# Patient Record
Sex: Male | Born: 1965 | Race: White | Hispanic: No | Marital: Single | Smoking: Current every day smoker
Health system: Southern US, Community
[De-identification: ages and names within clinical notes are randomized; demographics above are authoritative.]

## PROBLEM LIST (undated history)

## (undated) DIAGNOSIS — I10 Essential (primary) hypertension: Secondary | ICD-10-CM

## (undated) HISTORY — PX: FOOT AMPUTATION THROUGH METATARSAL: SHX644

## (undated) HISTORY — PX: EYE SURGERY: SHX253

---

## 2017-12-15 ENCOUNTER — Encounter (HOSPITAL_COMMUNITY): Payer: Self-pay | Admitting: Emergency Medicine

## 2017-12-15 ENCOUNTER — Emergency Department (HOSPITAL_COMMUNITY): Payer: Medicaid Other

## 2017-12-15 ENCOUNTER — Other Ambulatory Visit: Payer: Self-pay

## 2017-12-15 ENCOUNTER — Emergency Department (HOSPITAL_COMMUNITY)
Admission: EM | Admit: 2017-12-15 | Discharge: 2017-12-16 | Disposition: A | Discharge status: Home / Self Care | Payer: Medicaid Other | Attending: Emergency Medicine | Admitting: Emergency Medicine

## 2017-12-15 DIAGNOSIS — M79671 Pain in right foot: Secondary | ICD-10-CM | POA: Diagnosis present

## 2017-12-15 DIAGNOSIS — Z79899 Other long term (current) drug therapy: Secondary | ICD-10-CM | POA: Insufficient documentation

## 2017-12-15 MED ORDER — KETOROLAC TROMETHAMINE 60 MG/2ML IM SOLN
60.0000 mg | Freq: Once | INTRAMUSCULAR | Status: AC
  Administered 2017-12-16: 60 mg via INTRAMUSCULAR
  Filled 2017-12-15: qty 2

## 2017-12-15 NOTE — ED Triage Notes (Addendum)
Patient c/o right foot pain x 3 days. Denies injury. Ambulatory. Partial foot amputation. Hx osteomyelitis.

## 2017-12-15 NOTE — ED Provider Notes (Signed)
[facility]-EMERGENCY DEPT Provider Note   CSN: [id number] Arrival date & time: 12/15/17  1850     History   Chief Complaint Chief Complaint  Patient presents with  . Foot Pain    HPI Dwayne Alvarez is a 52 y.o. male.   52 year old male presents to the emergency department for evaluation of right foot pain x3 days.  He describes the pain as burning.  It radiates slightly proximally along the medial aspect of his lower extremity.  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  Amputation undergone in the setting of osteomyelitis.  The patient expresses concern that he may be developing osteomyelitis again.  He has been taking Tylenol and ibuprofen for symptoms without relief.  His pain is aggravated with weightbearing.  He denies any known trauma or injury.  No associated fevers.  He does endorse a history of chronic pain in his right foot, but states this feels different.     History reviewed. No pertinent past medical history.  There are no active problems to display for this patient.   History reviewed. No pertinent surgical history.      Home Medications    Prior to Admission medications   Medication Sig Start Date End Date Taking? Authorizing Provider  acetaminophen (TYLENOL) 500 MG tablet Take 500 mg by mouth daily as needed (foot pain).   Yes [provider]  cephALEXin (KEFLEX) 500 MG capsule Take 1 capsule (500 mg total) by mouth 4 (four) times daily. 12/16/17   Antony Madura, PA-C  naproxen (NAPROSYN) 500 MG tablet Take 1 tablet (500 mg total) by mouth 2 (two) times daily. 12/16/17   Antony Madura, PA-C    Family History No family history on file.  Social History Social History   Tobacco Use  . Smoking status: Not on file  Substance Use Topics  . Alcohol use: Not on file  . Drug use: Not on file     Allergies   Patient has no known allergies.   Review of Systems Review of Systems Ten systems reviewed  and are negative for acute change, except as noted in the HPI.    Physical Exam Updated Vital Signs BP 112/76   Pulse 77   Temp 97.9 F (36.6 C) (Oral)   Resp 18   SpO2 99%   Physical Exam  Constitutional: He is oriented to person, place, and time. He appears well-developed and well-nourished. No distress.  Nontoxic appearing and in NAD  HENT:  Head: Normocephalic and atraumatic.  Eyes: Conjunctivae and EOM are normal. No scleral icterus.  Neck: Normal range of motion.  Cardiovascular: Normal rate, regular rhythm and intact distal pulses.  Capillary refill brisk in distal RLE  Pulmonary/Chest: Effort normal. No respiratory distress.  Respirations even and unlabored  Musculoskeletal: Normal range of motion.  Prior foot amputation on the right. Compartments soft. No erythema, hear to touch, induration at prior surgical sites. Normal ROM of R ankle.  Neurological: He is alert and oriented to person, place, and time. He exhibits normal muscle tone. Coordination normal.  Skin: Skin is warm and dry. No rash noted. He is not diaphoretic. No erythema. No pallor.  Psychiatric: He has a normal mood and affect. His behavior is normal.  Nursing note and vitals reviewed.    ED Treatments / Results  Labs (all labs ordered are listed, but only abnormal results are displayed) Labs Reviewed  SEDIMENTATION RATE - Abnormal; Notable for the following components:  Result Value   Sed Rate 20 (*)    All other components within normal limits  CBC WITH DIFFERENTIAL/PLATELET  C-REACTIVE PROTEIN    EKG None  Radiology Dg Foot Complete Right  Result Date: 12/15/2017 CLINICAL DATA:  52 year old male with right lower extremity pain. Prior amputation. Query osteomyelitis. EXAM: RIGHT FOOT COMPLETE - 3+ VIEW COMPARISON:  None. FINDINGS: Previous right foot amputation leaving only the talus and calcaneus. Mild cortical irregularity along the osteotomy site, but no suspicious cortical osteolysis  is identified. Dystrophic calcifications anterior to the mortise joint. Osteopenia in the distal tibia and fibula. Soft tissue stump with no subcutaneous gas identified. IMPRESSION: Prior right foot amputation with no plain radiographic evidence of osteomyelitis. Electronically Signed   By: Odessa FlemingH  Hall M.D.   On: 12/15/2017 23:56    Procedures Procedures (including critical care time)  Medications Ordered in ED Medications  ketorolac (TORADOL) injection 60 mg (60 mg Intramuscular Given 12/16/17 0034)     Initial Impression / Assessment and Plan / ED Course  I have reviewed the triage vital signs and the nursing notes.  Pertinent labs & imaging results that were available during my care of the patient were reviewed by me and considered in my medical decision making (see chart for details).     52 year old male presents to the emergency department for evaluation of foot pain.  He has a history of osteomyelitis which was managed with foot amputation in 2014.  [place]  Patient is afebrile today and without leukocytosis.  His exam is not specifically consistent with infection.  There is no erythema, heat to touch, lymphangitic streaking.  Compartments soft.  No induration, fluctuance.  Initial x-ray of the foot today is not suggestive of osteomyelitis.  Given elevated sed rate, will cover with initial dose of Keflex.  This test, however, is nonspecific.  Patient will require continued outpatient orthopedic follow-up.  Referral provided.  I do not believe further emergent work-up is indicated at this time.  Return precautions discussed and provided. Patient discharged in stable condition with no unaddressed concerns.   Final Clinical Impressions(s) / ED Diagnoses   Final diagnoses:  Foot pain, right    ED Discharge Orders        Ordered    naproxen (NAPROSYN) 500 MG tablet  2 times daily     12/16/17 0250    cephALEXin (KEFLEX) 500 MG capsule  4  times daily     12/16/17 0250       Antony MaduraHumes, Sharron Petruska, PA-C 12/16/17 0355    Geoffery Lyonselo, Douglas, MD 12/16/17 [phone]

## 2017-12-16 LAB — CBC WITH DIFFERENTIAL/PLATELET
BASOS ABS: 0 10*3/uL (ref 0.0–0.1)
BASOS PCT: 1 %
EOS ABS: 0.3 10*3/uL (ref 0.0–0.7)
EOS PCT: 3 %
HCT: 41.9 % (ref 39.0–52.0)
Hemoglobin: 14.9 g/dL (ref 13.0–17.0)
Lymphocytes Relative: 34 %
Lymphs Abs: 2.9 10*3/uL (ref 0.7–4.0)
MCH: 31.4 pg (ref 26.0–34.0)
MCHC: 35.6 g/dL (ref 30.0–36.0)
MCV: 88.4 fL (ref 78.0–100.0)
MONO ABS: 0.9 10*3/uL (ref 0.1–1.0)
Monocytes Relative: 11 %
Neutro Abs: 4.5 10*3/uL (ref 1.7–7.7)
Neutrophils Relative %: 51 %
PLATELETS: 195 10*3/uL (ref 150–400)
RBC: 4.74 MIL/uL (ref 4.22–5.81)
RDW: 13.1 % (ref 11.5–15.5)
WBC: 8.6 10*3/uL (ref 4.0–10.5)

## 2017-12-16 LAB — SEDIMENTATION RATE: Sed Rate: 20 mm/hr — ABNORMAL HIGH (ref 0–16)

## 2017-12-16 LAB — C-REACTIVE PROTEIN: CRP: <0.8 mg/dL (ref <1.0)

## 2017-12-16 MED ORDER — NAPROXEN 500 MG PO TABS: 500.0000 mg | ORAL_TABLET | Freq: Two times a day (BID) | ORAL | 0 refills | Status: DC

## 2017-12-16 MED ORDER — CEPHALEXIN 500 MG PO CAPS: 500.0000 mg | ORAL_CAPSULE | Freq: Four times a day (QID) | ORAL | 0 refills | Status: DC

## 2017-12-16 NOTE — Discharge Instructions (Signed)
We recommend close follow-up with an orthopedist for further evaluation of your symptoms.  Continue with icing 3-4 times per day and elevation.  Take naproxen as prescribed for pain.  You have also been prescribed Keflex to cover for possible infection.  Return to the ED as needed for new or concerning symptoms.

## 2017-12-16 NOTE — ED Notes (Signed)
Turkey sandwich and apple juice given to pt.

## 2017-12-30 ENCOUNTER — Other Ambulatory Visit: Payer: Self-pay

## 2017-12-30 ENCOUNTER — Emergency Department (HOSPITAL_COMMUNITY)
Admission: EM | Admit: 2017-12-30 | Discharge: 2017-12-30 | Disposition: A | Discharge status: Other Acute Inpatient Hospital | Payer: Medicaid Other | Attending: Emergency Medicine | Admitting: Emergency Medicine

## 2017-12-30 ENCOUNTER — Encounter (HOSPITAL_COMMUNITY): Payer: Self-pay | Admitting: Emergency Medicine

## 2017-12-30 ENCOUNTER — Encounter (HOSPITAL_COMMUNITY): Payer: Self-pay | Admitting: *Deleted

## 2017-12-30 ENCOUNTER — Inpatient Hospital Stay (HOSPITAL_COMMUNITY)
Admission: AD | Admit: 2017-12-30 | Discharge: 2018-01-04 | DRG: 885 | Disposition: A | Discharge status: Home / Self Care | Payer: Medicaid Other | Source: Intra-hospital | Attending: Psychiatry | Admitting: Psychiatry

## 2017-12-30 DIAGNOSIS — F11229 Opioid dependence with intoxication, unspecified: Secondary | ICD-10-CM | POA: Diagnosis present

## 2017-12-30 DIAGNOSIS — I10 Essential (primary) hypertension: Secondary | ICD-10-CM | POA: Diagnosis present

## 2017-12-30 DIAGNOSIS — F1994 Other psychoactive substance use, unspecified with psychoactive substance-induced mood disorder: Secondary | ICD-10-CM | POA: Diagnosis present

## 2017-12-30 DIAGNOSIS — F332 Major depressive disorder, recurrent severe without psychotic features: Secondary | ICD-10-CM

## 2017-12-30 DIAGNOSIS — Z89431 Acquired absence of right foot: Secondary | ICD-10-CM | POA: Diagnosis not present

## 2017-12-30 DIAGNOSIS — Z590 Homelessness: Secondary | ICD-10-CM

## 2017-12-30 DIAGNOSIS — F329 Major depressive disorder, single episode, unspecified: Secondary | ICD-10-CM | POA: Diagnosis present

## 2017-12-30 DIAGNOSIS — F1520 Other stimulant dependence, uncomplicated: Secondary | ICD-10-CM | POA: Diagnosis present

## 2017-12-30 DIAGNOSIS — B1920 Unspecified viral hepatitis C without hepatic coma: Secondary | ICD-10-CM | POA: Diagnosis present

## 2017-12-30 DIAGNOSIS — F1410 Cocaine abuse, uncomplicated: Secondary | ICD-10-CM

## 2017-12-30 DIAGNOSIS — F17200 Nicotine dependence, unspecified, uncomplicated: Secondary | ICD-10-CM | POA: Diagnosis not present

## 2017-12-30 DIAGNOSIS — F1120 Opioid dependence, uncomplicated: Secondary | ICD-10-CM

## 2017-12-30 DIAGNOSIS — R45851 Suicidal ideations: Secondary | ICD-10-CM

## 2017-12-30 DIAGNOSIS — Z23 Encounter for immunization: Secondary | ICD-10-CM

## 2017-12-30 DIAGNOSIS — F17210 Nicotine dependence, cigarettes, uncomplicated: Secondary | ICD-10-CM | POA: Diagnosis not present

## 2017-12-30 DIAGNOSIS — F1920 Other psychoactive substance dependence, uncomplicated: Secondary | ICD-10-CM

## 2017-12-30 HISTORY — DX: Essential (primary) hypertension: I10

## 2017-12-30 LAB — SALICYLATE LEVEL: Salicylate Lvl: <7 mg/dL (ref 2.8–30.0)

## 2017-12-30 LAB — COMPREHENSIVE METABOLIC PANEL
ALBUMIN: 4.5 g/dL (ref 3.5–5.0)
ALK PHOS: 60 U/L (ref 38–126)
ALT: 180 U/L — AB (ref 0–44)
AST: 174 U/L — ABNORMAL HIGH (ref 15–41)
Anion gap: 9 (ref 5–15)
BUN: 25 mg/dL — AB (ref 6–20)
CO2: 23 mmol/L (ref 22–32)
CREATININE: 1.03 mg/dL (ref 0.61–1.24)
Calcium: 9.2 mg/dL (ref 8.9–10.3)
Chloride: 102 mmol/L (ref 98–111)
GFR calc Af Amer: >60 mL/min (ref >60)
GFR calc non Af Amer: >60 mL/min (ref >60)
GLUCOSE: 122 mg/dL — AB (ref 70–99)
Potassium: 4 mmol/L (ref 3.5–5.1)
SODIUM: 134 mmol/L — AB (ref 135–145)
Total Bilirubin: 2.1 mg/dL — ABNORMAL HIGH (ref 0.3–1.2)
Total Protein: 9.2 g/dL — ABNORMAL HIGH (ref 6.5–8.1)

## 2017-12-30 LAB — RAPID URINE DRUG SCREEN, HOSP PERFORMED
Amphetamines: POSITIVE — AB
Benzodiazepines: NOT DETECTED
COCAINE: POSITIVE — AB
Opiates: POSITIVE — AB
TETRAHYDROCANNABINOL: NOT DETECTED

## 2017-12-30 LAB — CBC
HEMATOCRIT: 42.9 % (ref 39.0–52.0)
HEMOGLOBIN: 15.3 g/dL (ref 13.0–17.0)
MCH: 31.5 pg (ref 26.0–34.0)
MCHC: 35.7 g/dL (ref 30.0–36.0)
MCV: 88.5 fL (ref 78.0–100.0)
Platelets: 180 10*3/uL (ref 150–400)
RBC: 4.85 MIL/uL (ref 4.22–5.81)
RDW: 13.4 % (ref 11.5–15.5)
WBC: 11.4 10*3/uL — ABNORMAL HIGH (ref 4.0–10.5)

## 2017-12-30 LAB — ACETAMINOPHEN LEVEL: Acetaminophen (Tylenol), Serum: <10 ug/mL — ABNORMAL LOW (ref 10–30)

## 2017-12-30 LAB — ETHANOL: Alcohol, Ethyl (B): <10 mg/dL (ref <10)

## 2017-12-30 MED ORDER — NAPROXEN 500 MG PO TABS
500.0000 mg | ORAL_TABLET | Freq: Two times a day (BID) | ORAL | Status: DC | PRN
  Administered 2018-01-03: 500 mg via ORAL
  Filled 2017-12-30: qty 1

## 2017-12-30 MED ORDER — MAGNESIUM HYDROXIDE 400 MG/5ML PO SUSP: 30.0000 mL | Freq: Every day | ORAL | Status: DC | PRN

## 2017-12-30 MED ORDER — ALUM & MAG HYDROXIDE-SIMETH 200-200-20 MG/5ML PO SUSP: 30.0000 mL | ORAL | Status: DC | PRN

## 2017-12-30 MED ORDER — ACETAMINOPHEN 325 MG PO TABS
650.0000 mg | ORAL_TABLET | Freq: Four times a day (QID) | ORAL | Status: DC | PRN
  Administered 2017-12-30 – 2018-01-03 (×2): 650 mg via ORAL
  Filled 2017-12-30 (×2): qty 2

## 2017-12-30 MED ORDER — SERTRALINE HCL 25 MG PO TABS
25.0000 mg | ORAL_TABLET | Freq: Every day | ORAL | Status: DC
  Administered 2017-12-30 – 2017-12-31 (×2): 25 mg via ORAL
  Filled 2017-12-30 (×5): qty 1

## 2017-12-30 MED ORDER — CLONIDINE HCL 0.1 MG PO TABS
0.1000 mg | ORAL_TABLET | Freq: Every day | ORAL | Status: DC
  Administered 2018-01-04: 0.1 mg via ORAL
  Filled 2017-12-30 (×2): qty 1

## 2017-12-30 MED ORDER — PNEUMOCOCCAL VAC POLYVALENT 25 MCG/0.5ML IJ INJ
0.5000 mL | INJECTION | INTRAMUSCULAR | Status: AC
  Administered 2017-12-31: 0.5 mL via INTRAMUSCULAR

## 2017-12-30 MED ORDER — HYDROXYZINE HCL 25 MG PO TABS
25.0000 mg | ORAL_TABLET | Freq: Four times a day (QID) | ORAL | Status: DC | PRN
  Administered 2017-12-30 – 2018-01-03 (×7): 25 mg via ORAL
  Filled 2017-12-30 (×7): qty 1

## 2017-12-30 MED ORDER — HYDROXYZINE HCL 25 MG PO TABS
25.0000 mg | ORAL_TABLET | Freq: Three times a day (TID) | ORAL | Status: DC | PRN
  Administered 2017-12-30: 25 mg via ORAL
  Filled 2017-12-30: qty 1

## 2017-12-30 MED ORDER — LOPERAMIDE HCL 2 MG PO CAPS: 2.0000 mg | ORAL_CAPSULE | ORAL | Status: DC | PRN

## 2017-12-30 MED ORDER — CLONIDINE HCL 0.1 MG PO TABS
0.1000 mg | ORAL_TABLET | Freq: Four times a day (QID) | ORAL | Status: AC
  Administered 2017-12-30 – 2018-01-01 (×10): 0.1 mg via ORAL
  Filled 2017-12-30 (×12): qty 1

## 2017-12-30 MED ORDER — CLONIDINE HCL 0.1 MG PO TABS
0.1000 mg | ORAL_TABLET | ORAL | Status: AC
  Administered 2018-01-02 – 2018-01-03 (×4): 0.1 mg via ORAL
  Filled 2017-12-30 (×4): qty 1

## 2017-12-30 MED ORDER — METHOCARBAMOL 500 MG PO TABS
500.0000 mg | ORAL_TABLET | Freq: Three times a day (TID) | ORAL | Status: DC | PRN
  Administered 2017-12-30 – 2018-01-03 (×5): 500 mg via ORAL
  Filled 2017-12-30 (×5): qty 1

## 2017-12-30 MED ORDER — ONDANSETRON 4 MG PO TBDP: 4.0000 mg | ORAL_TABLET | Freq: Four times a day (QID) | ORAL | Status: DC | PRN

## 2017-12-30 MED ORDER — NICOTINE 21 MG/24HR TD PT24
21.0000 mg | MEDICATED_PATCH | Freq: Every day | TRANSDERMAL | Status: DC
  Administered 2017-12-31 – 2018-01-02 (×3): 21 mg via TRANSDERMAL
  Filled 2017-12-30 (×6): qty 1

## 2017-12-30 MED ORDER — DICYCLOMINE HCL 20 MG PO TABS: 20.0000 mg | ORAL_TABLET | Freq: Four times a day (QID) | ORAL | Status: DC | PRN

## 2017-12-30 NOTE — ED Notes (Signed)
Just talked to pelham, driver will be here in ten minutes
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
## 2017-12-30 NOTE — ED Provider Notes (Signed)
Patient is a 52 year old male who was seen last night for substance abuse and depression.  He needs a medical clearance note.  He currently is sleeping but wakes up easily and answers questions.  He does have some elevation in his LFTs.  He does not have any associated abdominal pain which would warrant further imaging.  He denies any Tylenol use or medications such as Percocet that contain Tylenol.  He denies any alcohol use.  He denies any known elevation in his LFTs.  I will add a hepatitis panel and I did advise him that he will need to follow-up with her PCP regarding this after discharge.  He is otherwise medically cleared and awaiting TTS disposition.   Rolan BuccoBelfi, Lacey Wallman, MD 12/30/17 [phone]

## 2017-12-30 NOTE — ED Notes (Signed)
Awaiting [facility] a [facility]
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
## 2017-12-30 NOTE — ED Notes (Signed)
Awaiting [facility] for transport to [facility]

## 2017-12-30 NOTE — Tx Team (Signed)
Initial Treatment Plan 12/30/2017 4:14 PM Al Karie SodaGaudin [id number]    PATIENT STRESSORS: Loss of son's mother Medication change or noncompliance Substance abuse   PATIENT STRENGTHS: Ability for insight Average or above average intelligence Capable of independent living General fund of knowledge   PATIENT IDENTIFIED PROBLEMS: Depression Suicidal thoughts Substance abuse "I need grief counseling"                     DISCHARGE CRITERIA:  Ability to meet basic life and health needs Improved stabilization in mood, thinking, and/or behavior Verbal commitment to aftercare and medication compliance Withdrawal symptoms are absent or subacute and managed without 24-hour nursing intervention  PRELIMINARY DISCHARGE PLAN: Attend aftercare/continuing care group  PATIENT/FAMILY INVOLVEMENT: This treatment plan has been presented to and reviewed with the patient, Dwayne PolingClint Alvarez, and/or family member, .  The patient and family have been given the opportunity to ask questions and make suggestions.  Juliannah Ohmann, Luc[place]ok Wayne, [place]RN 12/30/2017, 4:14 PM

## 2017-12-30 NOTE — Plan of Care (Signed)
Patient verbalizes understanding of information, education provided. 
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
## 2017-12-30 NOTE — H&P (Signed)
Psychiatric Admission Assessment Adult  Patient Identification: Dwayne Alvarez MRN:  [id number] Date of Evaluation:  12/30/2017 Chief Complaint:  MDD  Principal Diagnosis: <principal problem not specified> Diagnosis:   Patient Active Problem List   Diagnosis Date Noted  . Substance induced mood disorder ([place]) [F19.94] 12/30/2017  . MDD (major depressive disorder), recurrent severe, without psychosis ([place]) [F33.2] 12/30/2017  . Suicidal ideation [R45.851]    History of Present Illness: Patient is seen and examined.  Patient is a 52 year old male with a past psychiatric history significant for methamphetamine use disorder, cocaine use disorder and opiate use disorder.  Patient stated that he had been living in a halfway house in the [place] area recently.  He stated that even though he wanted to stay sober there were drugs in the facility.  His history over the last 8 months is quite eventful.  He has been living in [facility] and was using substances regularly.  He ended up overdosing and being admitted in the emergency department at [facility].  Once he woke up from the overdose he left.  He then went back to [facility] and abuse substances.  Something over the next several months occurred and he decided to seek assistance.  Over the last several months since then he has been in several cities and [place] in [facility] seeking assistance.  He had a parole violation where he was in jail for a week in [place], [place].  After then he drove over thousand miles looking for treatment facilities.  He has been in facilities in [place], [place], [place].  Most recently been in a halfway house where he currently resides.  He left that facility on July 1.  He had been sober since sometime in March.  When he left the facility he relapsed on heroin, cocaine and methamphetamines.  He stated he stayed in a hotel one night, and then was  essentially homeless.  He stated he is depressed right now on his suicidal because the fact that his wife was murdered by someone.  He stated that the person got off for this.  They have a young child.  He lives somewhere around this area.  He wanted to establish a relationship with the child.  That has not gone well.  He admitted to helplessness, hopelessness and worthlessness.  He admitted to having hepatitis C.  His drug screen was positive for cocaine, methamphetamines as well as opiates.  He was admitted to the hospital for evaluation and stabilization. Associated Signs/Symptoms: Depression Symptoms:  depressed mood, anhedonia, insomnia, psychomotor agitation, fatigue, feelings of worthlessness/guilt, difficulty concentrating, hopelessness, suicidal thoughts without plan, anxiety, panic attacks, loss of energy/fatigue, disturbed sleep, weight loss, (Hypo) Manic Symptoms:  Impulsivity, Irritable Mood, Anxiety Symptoms:  Excessive Worry, Psychotic Symptoms:  Denied PTSD Symptoms: Negative Total Time spent with patient: 45 minutes  Past Psychiatric History: Patient denied any psychiatric hospitalizations for anything other than substance related issues.  Is the patient at risk to self? Yes.    Has the patient been a risk to self in the past 6 months? Yes.    Has the patient been a risk to self within the distant past? No.  Is the patient a risk to others? Yes.    Has the patient been a risk to others in the past 6 months? Yes.    Has the patient been a risk to others within the distant past? Yes.     Prior Inpatient  Therapy:   Prior Outpatient Therapy:    Alcohol Screening: 1. How often do you have a drink containing alcohol?: Monthly or less 2. How many drinks containing alcohol do you have on a typical day when you are drinking?: 1 or 2 3. How often do you have six or more drinks on one occasion?: Never AUDIT-C Score: 1 4. How often during the last year have you found that  you were not able to stop drinking once you had started?: Never 5. How often during the last year have you failed to do what was normally expected from you becasue of drinking?: Never 6. How often during the last year have you needed a first drink in the morning to get yourself going after a heavy drinking session?: Never 7. How often during the last year have you had a feeling of guilt of remorse after drinking?: Never 8. How often during the last year have you been unable to remember what happened the night before because you had been drinking?: Never 9. Have you or someone else been injured as a result of your drinking?: No 10. Has a relative or friend or a doctor or another health worker been concerned about your drinking or suggested you cut down?: No Alcohol Use Disorder Identification Test Final Score (AUDIT): 1 Intervention/Follow-up: AUDIT Score <7 follow-up not indicated Substance Abuse History in the last 12 months:  Yes.   Consequences of Substance Abuse: Medical Consequences:  Hepatitis C Previous Psychotropic Medications: Yes  Psychological Evaluations: Yes  Past Medical History:  Past Medical History:  Diagnosis Date  . Hypertension     Past Surgical History:  Procedure Laterality Date  . FOOT AMPUTATION THROUGH METATARSAL Right    Family History: History reviewed. No pertinent family history. Family Psychiatric  History: Denied Tobacco Screening: Have you used any form of tobacco in the last 30 days? (Cigarettes, Smokeless Tobacco, Cigars, and/or Pipes): Yes Tobacco use, Select all that apply: 5 or more cigarettes per day Are you interested in Tobacco Cessation Medications?: Yes, will notify MD for an order Counseled patient on smoking cessation including recognizing danger situations, developing coping skills and basic information about quitting provided: Refused/Declined practical counseling Social History:  Social History   Substance and Sexual Activity  Alcohol  Use Not Currently     Social History   Substance and Sexual Activity  Drug Use Yes  . Types: Methamphetamines, Cocaine   Comment: heroin    Additional Social History:                           Allergies:  No Known Allergies Lab Results:  Results for orders placed or performed during the hospital encounter of 12/30/17 (from the past 48 hour(s))  Comprehensive metabolic panel     Status: Abnormal   Collection Time: 12/30/17  6:14 AM  Result Value Ref Range   Sodium 134 (L) 135 - 145 mmol/L   Potassium 4.0 3.5 - 5.1 mmol/L   Chloride 102 98 - 111 mmol/L    Comment: Please note change in reference range.   CO2 23 22 - 32 mmol/L   Glucose, Bld 122 (H) 70 - 99 mg/dL    Comment: Please note change in reference range.   BUN 25 (H) 6 - 20 mg/dL    Comment: Please note change in reference range.   Creatinine, Ser 1.03 0.61 - 1.24 mg/dL   Calcium 9.2 8.9 - 10.3 mg/dL   Total  Protein 9.2 (H) 6.5 - 8.1 g/dL   Albumin 4.5 3.5 - 5.0 g/dL   AST 174 (H) 15 - 41 U/L   ALT 180 (H) 0 - 44 U/L    Comment: Please note change in reference range.   Alkaline Phosphatase 60 38 - 126 U/L   Total Bilirubin 2.1 (H) 0.3 - 1.2 mg/dL   GFR calc non Af Amer >60 >60 mL/min   GFR calc Af Amer >60 >60 mL/min    Comment: (NOTE) The eGFR has been calculated using the CKD EPI equation. This calculation has not been validated in all clinical situations. eGFR's persistently <60 mL/min signify possible Chronic Kidney Disease.    Anion gap 9 5 - 15    Comment: Performed at [facility], [place] [address], [place], [place] [address]  Ethanol     Status: None   Collection Time: 12/30/17  6:14 AM  Result Value Ref Range   Alcohol, Ethyl (B) <10 <10 mg/dL    Comment: (NOTE) Lowest detectable limit for serum alcohol is 10 mg/dL. For medical purposes only. Performed at [facility], [place] [address], [place], [place] [address]   Salicylate level     Status: None    Collection Time: 12/30/17  6:14 AM  Result Value Ref Range   Salicylate Lvl <8.3 2.8 - 30.0 mg/dL    Comment: Performed at [facility], [place] [address] [place], [place] [address]  Acetaminophen level     Status: Abnormal   Collection Time: 12/30/17  6:14 AM  Result Value Ref Range   Acetaminophen (Tylenol), Serum <10 (L) 10 - 30 ug/mL    Comment: (NOTE) Therapeutic concentrations vary significantly. A range of 10-30 ug/mL  may be an effective concentration for many patients. However, some  are best treated at concentrations outside of this range. Acetaminophen concentrations >150 ug/mL at 4 hours after ingestion  and >50 ug/mL at 12 hours after ingestion are often associated with  toxic reactions. Performed at [facility], [place] [address] [place], [place] [address]   cbc     Status: Abnormal   Collection Time: 12/30/17  6:14 AM  Result Value Ref Range   WBC 11.4 (H) 4.0 - 10.5 K/uL   RBC 4.85 4.22 - 5.81 MIL/uL   Hemoglobin 15.3 13.0 - 17.0 g/dL   HCT 42.9 39.0 - 52.0 %   MCV 88.5 78.0 - 100.0 fL   MCH 31.5 26.0 - 34.0 pg   MCHC 35.7 30.0 - 36.0 g/dL   RDW 13.4 11.5 - 15.5 %   Platelets 180 150 - 400 K/uL    Comment: Performed at [facility], [place] [address], [place], [place] [address]  Rapid urine drug screen (hospital performed)     Status: Abnormal   Collection Time: 12/30/17  6:32 AM  Result Value Ref Range   Opiates POSITIVE (A) NONE DETECTED   Cocaine POSITIVE (A) NONE DETECTED   Benzodiazepines NONE DETECTED NONE DETECTED   Amphetamines POSITIVE (A) NONE DETECTED   Tetrahydrocannabinol NONE DETECTED NONE DETECTED   Barbiturates (A) NONE DETECTED    Result not available. Reagent lot number recalled by manufacturer.    Comment: Performed at [facility], [place] [address], [place], [place] [address]    Blood Alcohol level:  Lab Results  Component Value Date   ETH <10 40/97/3532     Metabolic Disorder Labs:  No results found for: HGBA1C, MPG No results found  for: PROLACTIN No results found for: CHOL, TRIG, HDL, CHOLHDL, VLDL, LDLCALC  Current Medications: Current Facility-Administered Medications  Medication Dose Route Frequency Provider Last Rate Last Dose  . acetaminophen (TYLENOL) tablet 650 mg  650 mg Oral Q6H PRN Nanci Pina, FNP   650 mg at 12/30/17 1620  . alum & mag hydroxide-simeth (MAALOX/MYLANTA) 200-200-20 MG/5ML suspension 30 mL  30 mL Oral Q4H PRN Starkes, Takia S, FNP      . cloNIDine (CATAPRES) tablet 0.1 mg  0.1 mg Oral QID Sharma Covert, MD       Followed by  . [START ON 01/02/2018] cloNIDine (CATAPRES) tablet 0.1 mg  0.1 mg Oral BH-qamhs Derick [place]inara, Cordie Grice, MD       Followed by  . [START ON 01/04/2018] cloNIDine (CATAPRES) tablet 0.1 mg  0.1 mg Oral QAC breakfast Sharma Covert, MD      . dicyclomine (BENTYL) tablet 20 mg  20 mg Oral Q6H PRN Sharma Covert, MD      . hydrOXYzine (ATARAX/VISTARIL) tablet 25 mg  25 mg Oral Q6H PRN Sharma Covert, MD      . loperamide (IMODIUM) capsule 2-4 mg  2-4 mg Oral PRN Sharma Covert, MD      . magnesium hydroxide (MILK OF MAGNESIA) suspension 30 mL  30 mL Oral Daily PRN Starkes, Takia S, FNP      . methocarbamol (ROBAXIN) tablet 500 mg  500 mg Oral Q8H PRN Sharma Covert, MD      . naproxen (NAPROSYN) tablet 500 mg  500 mg Oral BID PRN Sharma Covert, MD      . Derrill Memo ON 12/31/2017] nicotine (NICODERM CQ - dosed in mg/24 hours) patch 21 mg  21 mg Transdermal Q0600 Starkes, Takia S, FNP      . ondansetron (ZOFRAN-ODT) disintegrating tablet 4 mg  4 mg Oral Q6H PRN Sharma Covert, MD      . Derrill Memo ON 12/31/2017] pneumococcal 23 valent vaccine (PNU-IMMUNE) injection 0.5 mL  0.5 mL Intramuscular Tomorrow-1000 Sharma Covert, MD      . sertraline (ZOLOFT) tablet 25 mg  25 mg Oral Daily Sharma Covert, MD       PTA Medications: Medications Prior to Admission  Medication Sig  Dispense Refill Last Dose  . acetaminophen (TYLENOL) 500 MG tablet Take 500 mg by mouth daily as needed (foot pain).   Past Week at Unknown time  . cephALEXin (KEFLEX) 500 MG capsule Take 1 capsule (500 mg total) by mouth 4 (four) times daily. 28 capsule 0   . naproxen (NAPROSYN) 500 MG tablet Take 1 tablet (500 mg total) by mouth 2 (two) times daily. 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 Constitutional: He is oriented to person, place, and time. He appears well-developed and well-nourished.  HENT:  Head: Atraumatic.  Respiratory: Effort normal.  Neurological: He is oriented to person, place, and time.    ROS  Blood pressure 119/72, pulse 90, temperature 98.3 F (36.8 C), temperature source Oral, resp. rate 20, height 6' (1.829 m), weight 86.2 kg (190 lb).Body mass index is 25.77 kg/m.  General Appearance: Disheveled  Eye Contact:  Minimal  Speech:  Normal Rate  Volume:  Increased  Mood:  Anxious, Depressed, Dysphoric and Irritable  Affect:  Congruent  Thought Process:  Coherent  Orientation:  Full (Time, Place, and Person)  Thought Content:  Logical  Suicidal Thoughts:  Yes.  without intent/plan  Homicidal Thoughts:  Yes.  without intent/plan  Memory:  Immediate;   Fair Recent;   Fair Remote;   Fair  Judgement:  Impaired  Insight:  Lacking  Psychomotor Activity:  Increased and Restlessness  Concentration:  Concentration: Fair and Attention Span: Fair  Recall:  [employer] of Knowledge:  Fair  Language:  Fair  Akathisia:  Negative  Handed:  Right  AIMS (if indicated):     Assets:  Desire for Improvement Resilience  ADL's:  Intact  Cognition:  WNL  Sleep:       Treatment Plan Summary: Daily contact with patient to assess and evaluate symptoms and  progress in treatment, Medication management and Plan Patient is seen and examined.  Patient is a 52 year old male with the above-stated past psychiatric history is admitted because of suicidal ideation.  He recently relapsed on substances.  He had been sober since March.  He will be admitted to the hospital.  He will be integrated into the milieu.  He will be seen by social work both individually and in groups.  He will be placed on 15-minute checks.  Options for housing will be explored.  He will be and started on Zoloft 25 mg p.o. daily.  He stated he had only used opiates once since March, so I do not think he needs to be on a withdrawal protocol, but we will put this in place just in case.  He mainly physically appears as though he is on methamphetamines.  He admitted to a long history of methamphetamines.  He has neuromuscular movements that are consistent with chronic methamphetamine use.  He admitted to suicidal and homicidal ideation towards the man who ended up killing his wife.  All these issues will have to be sorted out.  His liver function enzymes are significantly elevated and he does have a history of hepatitis C.  This will have to be addressed.  We will also checking for HIV.  Observation Level/Precautions:  Detox 15 minute checks  Laboratory:  Chemistry Profile  Psychotherapy:    Medications:    Consultations:    Discharge Concerns:    Estimated LOS:  Other:     Physician Treatment Plan for Primary Diagnosis: <principal problem not specified> Long Term Goal(s): Improvement in symptoms so as ready for discharge  Short Term Goals: Ability to identify changes in lifestyle to reduce recurrence of condition will improve, Ability to verbalize feelings will improve, Ability to disclose and discuss suicidal ideas, Ability to demonstrate self-control will improve, Ability to identify and develop effective coping behaviors will improve, Ability to maintain clinical measurements within normal  limits will improve and Ability to identify triggers associated with substance abuse/mental health issues will improve  Physician Treatment Plan for Secondary Diagnosis: Active Problems:   MDD (major depressive disorder), recurrent severe, without psychosis ([place])  Long Term Goal(s): Improvement in symptoms so as ready for discharge  Short Term Goals: Ability to identify changes in lifestyle to reduce recurrence of condition will improve, Ability to verbalize feelings will improve, Ability to disclose and discuss suicidal ideas, Ability to demonstrate self-control will improve, Ability to identify and develop effective coping behaviors will improve and Ability to maintain clinical measurements within normal limits will improve  I certify that inpatient services furnished can reasonably be expected to improve the patient's condition.  Sharma Covert, MD 7/3/20195:12 PM

## 2017-12-30 NOTE — Consult Note (Addendum)
[place] Psychiatry Consult   Reason for Consult:  Suicidal ideations Referring Physician:  Antonietta Breach, PA Patient Identification: Dwayne Alvarez MRN:  [id number] Principal Diagnosis: Suicidal ideation Diagnosis:   Patient Active Problem List   Diagnosis Date Noted  . Substance induced mood disorder [facility]) [F19.94] 12/30/2017    Total Time spent with patient: 30 minutes  Subjective:   Dwayne Alvarez is a 52 y.o. male patient admitted with worsening depression and suicidal thoughts with plan to overdose on a bunch of heroin.  He reports worsening depression with the trigger of the death of his child's mother anniversary of August 7.  He reports that his depressive symptoms have gotten worse over the last 3-4 days.  His history is notable for polysubstance abuse to include cocaine, opiates, and amphetamines with heroin being his drug of choice.  He reports his last use was 1 day prior to coming in to the hospital. On interview, he endorses current SI. He denies HI or AVH. He denies that substance use is a problem for him. UDS is positive for cocaine, amphetamines and opiates and BAL is negative on admission.    Past Psychiatric History: Bipolar, social anxiety disorder, PTSD.  Patient reports that he self medicated and has not seen a psychiatrist.  Patient reports multiple inpatient admissions for detox with most with [facility]   Risk to Self: Suicidal Ideation: Yes-Currently Present Suicidal Intent: Yes-Currently Present Is patient at risk for suicide?: Yes Suicidal Plan?: Yes-Currently Present Specify Current Suicidal Plan: to overdose Access to Means: No What has been your use of drugs/alcohol within the last 12 months?: heroin How many times?: 0 Other Self Harm Risks: NA Triggers for Past Attempts: None known Intentional Self Injurious Behavior: None Risk to Others: Homicidal  Ideation: No Thoughts of Harm to Others: No Current Homicidal Intent: No Current Homicidal Plan: No Access to Homicidal Means: No Identified Victim: NA History of harm to others?: No Assessment of Violence: None Noted Violent Behavior Description: NA Does patient have access to weapons?: No Criminal Charges Pending?: No Does patient have a court date: No Prior Inpatient Therapy: Prior Inpatient Therapy: No Prior Outpatient Therapy: Prior Outpatient Therapy: No Does patient have an ACCT team?: No Does patient have Intensive In-House Services?  : No Does patient have [facility] services? : No Does patient have P4CC services?: No  Past Medical History:  Past Medical History:  Diagnosis Date  . Hypertension     Past Surgical History:  Procedure Laterality Date  . FOOT AMPUTATION THROUGH METATARSAL Right    Family History: History reviewed. No pertinent family history. Family Psychiatric  History: As per patient he denies Social History:  Social History   Substance and Sexual Activity  Alcohol Use Not Currently     Social History   Substance and Sexual Activity  Drug Use Yes  . Types: Methamphetamines, Cocaine   Comment: heroin    Social History   Socioeconomic History  . Marital status: Single    Spouse name: Not on file  . Number of children: Not on file  . Years of education: Not on file  . Highest education level: Not on file  Occupational History  . Not on file  Social Needs  . Financial resource strain: Not on file  . Food insecurity:    Worry: Not on file    Inability: Not on file  . Transportation  needs:    Medical: Not on file    Non-medical: Not on file  Tobacco Use  . Smoking status: Current Every Day Smoker  . Smokeless tobacco: Never Used  Substance and Sexual Activity  . Alcohol use: Not Currently  . Drug use: Yes    Types: Methamphetamines, Cocaine    Comment: heroin  . Sexual activity: Yes  Lifestyle  . Physical activity:    Days per  week: Not on file    Minutes per session: Not on file  . Stress: Not on file  Relationships  . Social connections:    Talks on phone: Not on file    Gets together: Not on file    Attends religious service: Not on file    Active member of club or organization: Not on file    Attends meetings of clubs or organizations: Not on file    Relationship status: Not on file  Other Topics Concern  . Not on file  Social History Narrative  . Not on file   Additional Social History: N/A    Allergies:  No Known Allergies  Labs:  Results for orders placed or performed during the hospital encounter of 12/30/17 (from the past 48 hour(s))  Comprehensive metabolic panel     Status: Abnormal   Collection Time: 12/30/17  6:14 AM  Result Value Ref Range   Sodium 134 (L) 135 - 145 mmol/L   Potassium 4.0 3.5 - 5.1 mmol/L   Chloride 102 98 - 111 mmol/L    Comment: Please note change in reference range.   CO2 23 22 - 32 mmol/L   Glucose, Bld 122 (H) 70 - 99 mg/dL    Comment: Please note change in reference range.   BUN 25 (H) 6 - 20 mg/dL    Comment: Please note change in reference range.   Creatinine, Ser 1.03 0.61 - 1.24 mg/dL   Calcium 9.2 8.9 - 10.3 mg/dL   Total Protein 9.2 (H) 6.5 - 8.1 g/dL   Albumin 4.5 3.5 - 5.0 g/dL   AST 174 (H) 15 - 41 U/L   ALT 180 (H) 0 - 44 U/L    Comment: Please note change in reference range.   Alkaline Phosphatase 60 38 - 126 U/L   Total Bilirubin 2.1 (H) 0.3 - 1.2 mg/dL   GFR calc non Af Amer >60 >60 mL/min   GFR calc Af Amer >60 >60 mL/min    Comment: (NOTE) The eGFR has been calculated using the CKD EPI equation. This calculation has not been validated in all clinical situations. eGFR's persistently <60 mL/min signify possible Chronic Kidney Disease.    Anion gap 9 5 - 15    Comment: Performed at [facility], [place] [address], [place], [place] [address]  Ethanol     Status: None   Collection Time: 12/30/17  6:14 AM  Result Value Ref  Range   Alcohol, Ethyl (B) <10 <10 mg/dL    Comment: (NOTE) Lowest detectable limit for serum alcohol is 10 mg/dL. For medical purposes only. Performed at [facility] [place] [address] [place], [place] [address]   Salicylate level     Status: None   Collection Time: 12/30/17  6:14 AM  Result Value Ref Range   Salicylate Lvl <0.9 2.8 - 30.0 mg/dL    Comment: Performed at [facility], [place] [address] [place], [place] [address]  Acetaminophen level     Status: Abnormal   Collection Time:  12/30/17  6:14 AM  Result Value Ref Range   Acetaminophen (Tylenol), Serum <10 (L) 10 - 30 ug/mL    Comment: (NOTE) Therapeutic concentrations vary significantly. A range of 10-30 ug/mL  may be an effective concentration for many patients. However, some  are best treated at concentrations outside of this range. Acetaminophen concentrations >150 ug/mL at 4 hours after ingestion  and >50 ug/mL at 12 hours after ingestion are often associated with  toxic reactions. Performed at [facility], [place] [address], [place], [place] [address]   cbc     Status: Abnormal   Collection Time: 12/30/17  6:14 AM  Result Value Ref Range   WBC 11.4 (H) 4.0 - 10.5 K/uL   RBC 4.85 4.22 - 5.81 MIL/uL   Hemoglobin 15.3 13.0 - 17.0 g/dL   HCT 42.9 39.0 - 52.0 %   MCV 88.5 78.0 - 100.0 fL   MCH 31.5 26.0 - 34.0 pg   MCHC 35.7 30.0 - 36.0 g/dL   RDW 13.4 11.5 - 15.5 %   Platelets 180 150 - 400 K/uL    Comment: Performed at [facility], [place] [address] [place], [place] [address]  Rapid urine drug screen (hospital performed)     Status: Abnormal   Collection Time: 12/30/17  6:32 AM  Result Value Ref Range   Opiates POSITIVE (A) NONE DETECTED   Cocaine POSITIVE (A) NONE DETECTED   Benzodiazepines NONE DETECTED NONE DETECTED   Amphetamines POSITIVE (A) NONE DETECTED   Tetrahydrocannabinol NONE DETECTED NONE DETECTED   Barbiturates (A) NONE  DETECTED    Result not available. Reagent lot number recalled by manufacturer.    Comment: Performed at [facility], [place] [address] [place], [place] [address]    No current facility-administered medications for this encounter.    Current Outpatient Medications  Medication Sig Dispense Refill  . acetaminophen (TYLENOL) 500 MG tablet Take 500 mg by mouth daily as needed (foot pain).    . cephALEXin (KEFLEX) 500 MG capsule Take 1 capsule (500 mg total) by mouth 4 (four) times daily. 28 capsule 0  . naproxen (NAPROSYN) 500 MG tablet Take 1 tablet (500 mg total) by mouth 2 (two) times daily. 30 tablet 0    Musculoskeletal: Strength & Muscle Tone: within normal limits Gait & Station: UTA since patint was lying in bed. Patient leans: N/A  Psychiatric Specialty Exam: Physical Exam  Nursing note and vitals reviewed. Constitutional: He is oriented to person, place, and time. He appears well-nourished.  HENT:  Head: Normocephalic and atraumatic.  Neck: Normal range of motion.  Respiratory: Effort normal.  Musculoskeletal: Normal range of motion.  R foot amputation  Neurological: He is alert and oriented to person, place, and time.  Psychiatric: His behavior is normal. Judgment normal. Cognition and memory are normal. He exhibits a depressed mood. He expresses suicidal ideation. He expresses suicidal plans.    ROS  Blood pressure (!) 143/98, pulse (!) 104, temperature 98 F (36.7 C), temperature source Oral, resp. rate 20, SpO2 96 %.There is no height or weight on file to calculate BMI.  General Appearance: Disheveled  Eye Contact:  Poor  Speech:  Normal Rate and Slurred  Volume:  Increased  Mood:  Anxious, Depressed and Irritable  Affect:  Blunt and Depressed  Thought Process:  Linear and Descriptions of Associations: Intact  Orientation:  Full (Time, Place, and Person)  Thought Content:  Logical  Suicidal Thoughts:  Yes.  with intent/plan  Homicidal Thoughts:   No  Memory:  Immediate;   Fair Recent;   Fair  Judgement:  Impaired  Insight:  Shallow  Psychomotor Activity:  Increased and pscyhoomotor agitation  Concentration:  Concentration: Fair and Attention Span: Fair  Recall:  Poor  Fund of Knowledge:  Fair  Language:  Good  Akathisia:  No  Handed:  Right  AIMS (if indicated):   N/A  Assets:  Desire for Improvement Leisure Time Physical Health Social Support  ADL's:  Intact  Cognition:  WNL  Sleep:   N/A     Treatment Plan Summary: Daily contact with patient to assess and evaluate symptoms and progress in treatment and Medication management  Disposition: Recommend psychiatric Inpatient admission when medically cleared.  Nanci Pina, FNP 12/30/2017 11:21 AM   Patient seen face-to-face for psychiatric evaluation, chart reviewed and case discussed with the physician extender and developed treatment plan. Reviewed the information documented and agree with the treatment plan.  Buford Dresser, DO 12/30/17 1:23 PM

## 2017-12-30 NOTE — ED Notes (Signed)
Psych Provider at bedside. 

## 2017-12-30 NOTE — Progress Notes (Signed)
Psychoeducational Group Note  Date:  12/30/2017 Time:  2311  Group Topic/Focus:  Wrap-Up Group:   The focus of this group is to help patients review their daily goal of treatment and discuss progress on daily workbooks.  Participation Level: Did Not Attend  Participation Quality:  Not Applicable  Affect:  Not Applicable  Cognitive:  Not Applicable  Insight:  Not Applicable  Engagement in Group: Not Applicable  Additional Comments:  The patient did not attend group this evening.   Keiandre Cygan S 12/30/2017, 11:11 PM

## 2017-12-30 NOTE — ED Notes (Signed)
TTS at bedside. 

## 2017-12-30 NOTE — Progress Notes (Signed)
[name] is a 52 year old male pt admitted on voluntary basis. On admission Dwayne Alvarez appears anxious, fidgety and irritable. When asked if he was withdrawing from anything he denied and reports that he just had a bad headache. He does endorse passive SI but is able to contract for safety while in the hospital. He reports that he was using drugs recently and spoke about cocaine and meth use. He reports that he is not currently taking any medications but reports that he should be. He does report being hospitalized recently this year. He reports that his son's mother passed this year and spoke about how he needs some grief counseling why he is here. He reports that he lives in a halfway house and reports that he will probably go back there once he is discharged. [name] was oriented to the unit and safety maintained.

## 2017-12-30 NOTE — BH Assessment (Signed)
[facility]  Per Juanetta BeetsJacqueline Norman, DO, this pt requires psychiatric hospitalization at this time.  Malva LimesLinsey Strader, RN, [facility]; [facility] will be ready to receive pt at 14:00.  Pt has signed Voluntary Admission and Consent for Treatment, as well as Consent to Release Information to pt's mother, and signed forms [facility].  Pt's nurse, Marchelle Folksmanda, has been notified, and agrees to send original paperwork along with pt via [facility], and to call report to [phone].  Doylene Canninghomas Magalie Almon, [place] Behavioral Health Coordinator [phone]

## 2017-12-30 NOTE — ED Notes (Signed)
Gave report to GraylingBrooks, Charity fundraiserN at [facility]

## 2017-12-30 NOTE — ED Notes (Signed)
[facility] called for transport to [facility].  

## 2017-12-30 NOTE — [facility] Suicide Risk Assessment (Signed)
Suicide Risk Assessment  Discharge Assessment   [facility]   Principal Problem: Substance induced mood disorder [facility](HCC) Discharge Diagnoses:  Patient Active Problem List   Diagnosis Date Noted  . Substance induced mood disorder (HCC) [F19.94] 12/30/2017    Total Time spent with patient: 20 minutes  Musculoskeletal: Strength & Muscle Tone: within normal limits Gait & Station: normal Patient leans: N/A  Psychiatric Specialty Exam:   Blood pressure (!) 143/98, pulse (!) 104, temperature 98 F (36.7 C), temperature source Oral, resp. rate 20, SpO2 96 %.There is no height or weight on file to calculate BMI.   General Appearance: Disheveled  Eye Contact:  Poor  Speech:  Normal Rate and Slurred  Volume:  Increased  Mood:  Anxious, Depressed and Irritable  Affect:  Blunt and Depressed  Thought Process:  Linear and Descriptions of Associations: Intact  Orientation:  Full (Time, Place, and Person)  Thought Content:  Logical  Suicidal Thoughts:  Yes.  with intent/plan  Homicidal Thoughts:  No  Memory:  Immediate;   Fair Recent;   Fair  Judgement:  Impaired  Insight:  Shallow  Psychomotor Activity:  Increased and pscyhoomotor agitation  Concentration:  Concentration: Fair and Attention Span: Fair  Recall:  Poor  Fund of Knowledge:  Fair  Language:  Good  Akathisia:  No  Handed:  Right  AIMS (if indicated):     Assets:  Desire for Improvement Leisure Time Physical Health Social Support  ADL's:  Intact  Cognition:  WNL  Sleep:          Mental Status Per Nursing Assessment::   On Admission:     Demographic Factors:  Male, Caucasian, Low socioeconomic status and Unemployed  Loss Factors: Loss of significant relationship and Financial problems/change in socioeconomic status  Historical Factors: Family history of mental illness or substance abuse and Anniversary of important loss  Risk Reduction Factors:   Living with another person,  especially a relative, Positive social support, Positive therapeutic relationship and Positive coping skills or problem solving skills  Continued Clinical Symptoms:  Bipolar Disorder:   Bipolar II Alcohol/Substance Abuse/Dependencies Unstable or Poor Therapeutic Relationship Previous Psychiatric Diagnoses and Treatments Medical Diagnoses and Treatments/Surgeries  Cognitive Features That Contribute To Risk:  None    Suicide Risk:  Moderate:  Frequent suicidal ideation with limited intensity, and duration, some specificity in terms of plans, no associated intent, good self-control, limited dysphoria/symptomatology, some risk factors present, and identifiable protective factors, including available and accessible social support.    Plan Of Care/Follow-up recommendations:  Admit Inpatient.   Dwayne Haywardakia S Starkes, FNP 12/30/2017, 11:40 AM

## 2017-12-30 NOTE — BH Assessment (Signed)
Assessment Note  Dwayne Alvarez is an 52 y.o. male. Pt reports SI with a plan to overdose on heroin. Pt reports heroin, cocaine, and meth use. Pt denies HI and AVH. Pt states he currently resides in a half-way house. Per Pt he does not like his half-way house because it's "full of drugs." Pt denies previous inpatient treatment. Pt denies previous or current outpatient treatment. Pt denies current mental health medication.   Dr. Sharma Covert and Fredna Dow, NP recommend inpatient treatment.   Diagnosis:  F33.2 MDD; polysubstance abuse  Past Medical History:  Past Medical History:  Diagnosis Date  . Hypertension     Past Surgical History:  Procedure Laterality Date  . FOOT AMPUTATION THROUGH METATARSAL Right     Family History: History reviewed. No pertinent family history.  Social History:  reports that he has been smoking.  He has never used smokeless tobacco. He reports that he drank alcohol. He reports that he has current or past drug history. Drugs: Methamphetamines and Cocaine.  Additional Social History:  Alcohol / Drug Use Pain Medications: please see mar Prescriptions: please see mar Over the Counter: please see mar History of alcohol / drug use?: Yes Longest period of sobriety (when/how long): unknown Negative Consequences of Use: Financial, Legal, Personal relationships, Work / School Substance #1 Name of Substance 1: heroin 1 - Age of First Use: unknown 1 - Amount (size/oz): unknown 1 - Frequency: daily 1 - Duration: ongoing 1 - Last Use / Amount: unknown  CIWA: CIWA-Ar BP: (!) 143/98 Pulse Rate: (!) 104 COWS:    Allergies: No Known Allergies  Home Medications:  (Not in a hospital admission)  OB/GYN Status:  No LMP for male patient.  General Assessment Data [facility] of Assessment: [facility] TTS Assessment: In system Is this a Tele or Face-to-Face Assessment?: Face-to-Face Is this an Initial Assessment or a Re-assessment for this encounter?: Initial Assessment Marital  status: Single Maiden name: NA Is patient pregnant?: No Pregnancy Status: No Living Arrangements: Alone Can pt return to current living arrangement?: Yes Admission Status: Voluntary Is patient capable of signing voluntary admission?: Yes Referral Source: Self/Family/Friend Insurance type: Medicaid     Crisis Care Plan Living Arrangements: Alone Legal Guardian: Other:(self) Name of Psychiatrist: NA Name of Therapist: NA  Education Status Is patient currently in school?: No Is the patient employed, unemployed or receiving disability?: Unemployed  Risk to self with the past 6 months Suicidal Ideation: Yes-Currently Present Has patient been a risk to self within the past 6 months prior to admission? : No Suicidal Intent: Yes-Currently Present Has patient had any suicidal intent within the past 6 months prior to admission? : No Is patient at risk for suicide?: Yes Suicidal Plan?: Yes-Currently Present Has patient had any suicidal plan within the past 6 months prior to admission? : No Specify Current Suicidal Plan: to overdose Access to Means: No What has been your use of drugs/alcohol within the last 12 months?: heroin Previous Attempts/Gestures: No How many times?: 0 Other Self Harm Risks: NA Triggers for Past Attempts: None known Intentional Self Injurious Behavior: None Family Suicide History: No Recent stressful life event(s): Trauma (Comment), Loss (Comment) Persecutory voices/beliefs?: No Depression: Yes Depression Symptoms: Tearfulness, Isolating, Fatigue, Loss of interest in usual pleasures Substance abuse history and/or treatment for substance abuse?: No Suicide prevention information given to non-admitted patients: Not applicable  Risk to Others within the past 6 months Homicidal Ideation: No Does patient have any lifetime risk of violence toward others beyond the six  months prior to admission? : No Thoughts of Harm to Others: No Current Homicidal Intent:  No Current Homicidal Plan: No Access to Homicidal Means: No Identified Victim: NA History of harm to others?: No Assessment of Violence: None Noted Violent Behavior Description: NA Does patient have access to weapons?: No Criminal Charges Pending?: No Does patient have a court date: No Is patient on probation?: No  Psychosis Hallucinations: None noted Delusions: None noted  Mental Status Report Appearance/Hygiene: Unremarkable Eye Contact: Fair Motor Activity: Freedom of movement Speech: Logical/coherent Level of Consciousness: Alert Mood: Depressed Affect: Depressed Anxiety Level: Minimal Thought Processes: Coherent, Relevant Judgement: Unimpaired Orientation: Person, Place, Time, Situation Obsessive Compulsive Thoughts/Behaviors: None  Cognitive Functioning Concentration: Normal Memory: Recent Intact, Remote Intact Is patient IDD: No Is patient DD?: No Insight: Fair Impulse Control: Fair Appetite: Fair Have you had any weight changes? : No Change Sleep: No Change Total Hours of Sleep: 8 Vegetative Symptoms: None  ADLScreening [facility] Assessment Services) Patient's cognitive ability adequate to safely complete daily activities?: Yes Patient able to express need for assistance with ADLs?: Yes Independently performs ADLs?: Yes (appropriate for developmental age)  Prior Inpatient Therapy Prior Inpatient Therapy: No  Prior Outpatient Therapy Prior Outpatient Therapy: No Does patient have an ACCT team?: No Does patient have Intensive In-House Services?  : No Does patient have [facility] services? : No Does patient have P4CC services?: No  ADL Screening (condition at time of admission) Patient's cognitive ability adequate to safely complete daily activities?: Yes Is the patient deaf or have difficulty hearing?: No Does the patient have difficulty seeing, even when wearing glasses/contacts?: No Does the patient have difficulty concentrating, remembering, or making  decisions?: No Patient able to express need for assistance with ADLs?: Yes Does the patient have difficulty dressing or bathing?: No Independently performs ADLs?: Yes (appropriate for developmental age) Does the patient have difficulty walking or climbing stairs?: No Weakness of Legs: None Weakness of Arms/Hands: None       Abuse/Neglect Assessment (Assessment to be complete while patient is alone) Abuse/Neglect Assessment Can Be Completed: Yes Physical Abuse: Denies Verbal Abuse: Denies Sexual Abuse: Denies Exploitation of patient/patient's resources: Denies     Merchant navy officerAdvance Directives (For Healthcare) Does Patient Have a Medical Advance Directive?: No Would patient like information on creating a medical advance directive?: No - Patient declined    Additional Information 1:1 In Past 12 Months?: No CIRT Risk: No Elopement Risk: No Does patient have medical clearance?: Yes     Disposition:  Disposition Initial Assessment Completed for this Encounter: Yes  On Site Evaluation by:   Reviewed with Physician:    Emmit PomfretLevette,Brissia Delisa D 12/30/2017 8:03 AM

## 2017-12-30 NOTE — Progress Notes (Signed)
D: Patient observed ambulating in wheelchair. Observed to be quite restless, moving near constant in his WC. Patient states "I'm okay, really I am." Patient's affect initially appears irritable however patient is anxious, depressed and cooperative.  Reports R foot pain at a 6/10 and reports heat is only relief/decrease measure. States he cannot bear weight on his foot as "it makes my bones go places they shouldn't."  A: Heat pack provided, no prns requested at present.  Level III obs in place for safety. Emotional support offered. Patient encouraged to complete Suicide Safety Plan before discharge. Encouraged to attend and participate in unit programming.  Fall prevention plan in place and reviewed with patient as pt is a high fall risk due to pain.   R: Patient verbalizes understanding of POC, falls prevention education. On reassess, patient states pain is a 4/10. Patient denies SI/HI/AVH and remains safe on level III obs. Will continue to monitor throughout the night.

## 2017-12-30 NOTE — ED Notes (Signed)
Pt requesting pain medication for his chronic feet pain

## 2017-12-31 DIAGNOSIS — F1410 Cocaine abuse, uncomplicated: Secondary | ICD-10-CM

## 2017-12-31 DIAGNOSIS — F11229 Opioid dependence with intoxication, unspecified: Secondary | ICD-10-CM

## 2017-12-31 DIAGNOSIS — F17210 Nicotine dependence, cigarettes, uncomplicated: Secondary | ICD-10-CM

## 2017-12-31 DIAGNOSIS — F332 Major depressive disorder, recurrent severe without psychotic features: Principal | ICD-10-CM

## 2017-12-31 DIAGNOSIS — F1520 Other stimulant dependence, uncomplicated: Secondary | ICD-10-CM

## 2017-12-31 LAB — HEMOGLOBIN A1C
HEMOGLOBIN A1C: 5.1 % (ref 4.8–5.6)
Mean Plasma Glucose: 99.67 mg/dL

## 2017-12-31 LAB — LIPID PANEL
Cholesterol: 83 mg/dL (ref 0–200)
HDL: 26 mg/dL — AB (ref >40)
LDL CALC: 49 mg/dL (ref 0–99)
Total CHOL/HDL Ratio: 3.2 RATIO
Triglycerides: 42 mg/dL (ref <150)
VLDL: 8 mg/dL (ref 0–40)

## 2017-12-31 LAB — HEPATIC FUNCTION PANEL
ALT: 155 U/L — ABNORMAL HIGH (ref 0–44)
AST: 182 U/L — AB (ref 15–41)
Albumin: 3.3 g/dL — ABNORMAL LOW (ref 3.5–5.0)
Alkaline Phosphatase: 51 U/L (ref 38–126)
BILIRUBIN DIRECT: 0.2 mg/dL (ref 0.0–0.2)
BILIRUBIN TOTAL: 0.7 mg/dL (ref 0.3–1.2)
Indirect Bilirubin: 0.5 mg/dL (ref 0.3–0.9)
Total Protein: 7.4 g/dL (ref 6.5–8.1)

## 2017-12-31 LAB — TSH: TSH: 1.307 u[IU]/mL (ref 0.350–4.500)

## 2017-12-31 MED ORDER — SERTRALINE HCL 50 MG PO TABS
50.0000 mg | ORAL_TABLET | Freq: Every day | ORAL | Status: DC
  Administered 2018-01-01 – 2018-01-04 (×4): 50 mg via ORAL
  Filled 2017-12-31 (×6): qty 1
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
## 2017-12-31 NOTE — Plan of Care (Signed)
Nurse discussed anxiety, depression, coping skills with patient. 

## 2017-12-31 NOTE — Plan of Care (Signed)
Patient remains isolative, depressed, and has not attended programming.  Patient has not engaged in self harm and denies thoughts to do so.

## 2017-12-31 NOTE — [facility] Suicide Risk Assessment (Signed)
[facility] INPATIENT:  Family/Significant Other Suicide Prevention Education  Suicide Prevention Education:  Patient Refusal for Family/Significant Other Suicide Prevention Education: The patient Dwayne Alvarez has refused to provide written consent for family/significant other to be provided Family/Significant Other Suicide Prevention Education during admission and/or prior to discharge.  Physician notified.   SPE completed with pt, as pt refused to consent to family contact. SPI pamphlet provided to pt and pt was encouraged to share information with support network, ask questions, and talk about any concerns relating to SPE. Pt denies access to guns/firearms and verbalized understanding of information provided. Mobile Crisis information also provided to pt.    Rona RavensHeather S Lochlyn Zullo LCSW 12/31/2017, 11:34 AM

## 2017-12-31 NOTE — Progress Notes (Signed)
[facility]  12/31/2017 4:02 PM Dwayne PolingClint Alvarez  MRN:  [id number] Subjective: "I'm just feeling depressed overall because it's near the time that the mother of my daughter was murdered last year around August. That's why I did heroine, meth, and cocaine at the same time, wanting to die."  Objective: Pt seen and chart reviewed. Pt is alert/oriented x4, calm, cooperative, and appropriate to situation. Pt denies homicidal ideation and psychosis and does not appear to be responding to internal stimuli. Pt states that he has felt very hopeless overall but is feeling slightly better knowing that he is getting help. Pt reports that he has had feeling suicidal thoughts but none today.    Principal Problem: MDD (major depressive disorder), recurrent severe, without psychosis (HCC) Diagnosis:   Patient Active Problem List   Diagnosis Date Noted  . MDD (major depressive disorder), recurrent severe, without psychosis (HCC) [F33.2] 12/30/2017    Priority: High  . 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 Cocaine use disorder, mild, abuse (HCC) [F14.10]     Priority: High  . Opioid dependence with intoxication with complication (HCC) [N82.956][F11.229]     Priority: High  . Substance induced mood disorder (HCC) [F19.94] 12/30/2017  . Suicidal ideation [R45.851]    Total Time spent with patient: 25 minutes  Past Psychiatric History: see H&P  Past Medical History:  Past Medical History:  Diagnosis Date  . Hypertension     Past Surgical History:  Procedure Laterality Date  . FOOT AMPUTATION THROUGH METATARSAL Right    Family History: History reviewed. No pertinent family history. Family Psychiatric  History: see H&P Social History:  Social History   Substance and Sexual Activity  Alcohol Use Not Currently     Social History   Substance and Sexual Activity  Drug Use Yes  . Types: Methamphetamines, Cocaine   Comment: heroin    Social History   Socioeconomic History   . Marital status: Single    Spouse name: Not on file  . Number of children: Not on file  . Years of education: Not on file  . Highest education level: Not on file  Occupational History  . Not on file  Social Needs  . Financial resource strain: Not on file  . Food insecurity:    Worry: Not on file    Inability: Not on file  . Transportation needs:    Medical: Not on file    Non-medical: Not on file  Tobacco Use  . Smoking status: Current Every Day Smoker  . Smokeless tobacco: Never Used  Substance and Sexual Activity  . Alcohol use: Not Currently  . Drug use: Yes    Types: Methamphetamines, Cocaine    Comment: heroin  . Sexual activity: Not Currently  Lifestyle  . Physical activity:    Days per week: Not on file    Minutes per session: Not on file  . Stress: Not on file  Relationships  . Social connections:    Talks on phone: Not on file    Gets together: Not on file    Attends religious service: Not on file    Active member of club or organization: Not on file    Attends meetings of clubs or organizations: Not on file    Relationship status: Not on file  Other Topics Concern  . Not on file  Social History Narrative  . Not on file   Additional Social History:  Sleep: Fair  Appetite:  Fair  Current Medications: Current Facility-Administered Medications  Medication Dose Route Frequency Provider Last Rate Last Dose  . acetaminophen (TYLENOL) tablet 650 mg  650 mg Oral Q6H PRN Truman Hayward, FNP   650 mg at 12/30/17 1620  . alum & mag hydroxide-simeth (MAALOX/MYLANTA) 200-200-20 MG/5ML suspension 30 mL  30 mL Oral Q4H PRN Starkes, Takia S, FNP      . cloNIDine (CATAPRES) tablet 0.1 mg  0.1 mg Oral QID Antonieta Pert, MD   0.1 mg at 12/31/17 1222   Followed by  . [START ON 01/02/2018] cloNIDine (CATAPRES) tablet 0.1 mg  0.1 mg Oral BH-qamhs Clary, Marlane Mingle, MD       Followed by  . [START ON 01/04/2018] cloNIDine (CATAPRES)  tablet 0.1 mg  0.1 mg Oral QAC breakfast Antonieta Pert, MD      . dicyclomine (BENTYL) tablet 20 mg  20 mg Oral Q6H PRN Antonieta Pert, MD      . hydrOXYzine (ATARAX/VISTARIL) tablet 25 mg  25 mg Oral Q6H PRN Antonieta Pert, MD   25 mg at 12/30/17 2336  . loperamide (IMODIUM) capsule 2-4 mg  2-4 mg Oral PRN Antonieta Pert, MD      . magnesium hydroxide (MILK OF MAGNESIA) suspension 30 mL  30 mL Oral Daily PRN Starkes, Takia S, FNP      . methocarbamol (ROBAXIN) tablet 500 mg  500 mg Oral Q8H PRN Antonieta Pert, MD   500 mg at 12/30/17 2336  . naproxen (NAPROSYN) tablet 500 mg  500 mg Oral BID PRN Antonieta Pert, MD      . nicotine (NICODERM CQ - dosed in mg/24 hours) patch 21 mg  21 mg Transdermal Q0600 Truman Hayward, FNP   21 mg at 12/31/17 0920  . ondansetron (ZOFRAN-ODT) disintegrating tablet 4 mg  4 mg Oral Q6H PRN Antonieta Pert, MD      . pneumococcal 23 valent vaccine (PNU-IMMUNE) injection 0.5 mL  0.5 mL Intramuscular Tomorrow-1000 Antonieta Pert, MD      . sertraline (ZOLOFT) tablet 25 mg  25 mg Oral Daily Antonieta Pert, MD   25 mg at 12/31/17 5366    Lab Results:  Results for orders placed or performed during the hospital encounter of 12/30/17 (from the past 48 hour(s))  Hemoglobin A1c     Status: None   Collection Time: 12/31/17  6:39 AM  Result Value Ref Range   Hgb A1c MFr Bld 5.1 4.8 - 5.6 %    Comment: (NOTE) Pre diabetes:          5.7%-6.4% Diabetes:              >6.4% Glycemic control for   <7.0% adults with diabetes    Mean Plasma Glucose 99.67 mg/dL    Comment: Performed at [facility], [address], [place], [place] [address]  Lipid panel     Status: Abnormal   Collection Time: 12/31/17  6:39 AM  Result Value Ref Range   Cholesterol 83 0 - 200 mg/dL   Triglycerides 42 <742 mg/dL   HDL 26 (L) >59 mg/dL   Total CHOL/HDL Ratio 3.2 RATIO   VLDL 8 0 - 40 mg/dL   LDL Cholesterol 49 0 - 99 mg/dL    Comment:         Total Cholesterol/HDL:CHD Risk Coronary Heart Disease Risk Table  Men   Women  1/2 Average Risk   3.4   3.3  Average Risk       5.0   4.4  2 X Average Risk   9.6   7.1  3 X Average Risk  23.4   11.0        Use the calculated Patient Ratio above and the CHD Risk Table to determine the patient's CHD Risk.        ATP III CLASSIFICATION (LDL):  <100     mg/dL   Optimal  161-096  mg/dL   Near or Above                    Optimal  130-159  mg/dL   Borderline  045-409  mg/dL   High  >811     mg/dL   Very High Performed at [facility], [address], [place], [place] [address]   TSH     Status: None   Collection Time: 12/31/17  6:39 AM  Result Value Ref Range   TSH 1.307 0.350 - 4.500 uIU/mL    Comment: Performed by a 3rd Generation assay with a functional sensitivity of <=0.01 uIU/mL. Performed at [facility], [address], [place], [place] [address]   Hepatic function panel     Status: Abnormal   Collection Time: 12/31/17  6:39 AM  Result Value Ref Range   Total Protein 7.4 6.5 - 8.1 g/dL   Albumin 3.3 (L) 3.5 - 5.0 g/dL   AST 130 (H) 15 - 41 U/L   ALT 155 (H) 0 - 44 U/L    Comment: Please note change in reference range.   Alkaline Phosphatase 51 38 - 126 U/L   Total Bilirubin 0.7 0.3 - 1.2 mg/dL   Bilirubin, Direct 0.2 0.0 - 0.2 mg/dL    Comment: Please note change in reference range.   Indirect Bilirubin 0.5 0.3 - 0.9 mg/dL    Comment: Performed at [facility], [address], [place], [place] [address]    Blood Alcohol level:  Lab Results  Component Value Date   ETH <10 12/30/2017    Metabolic Disorder Labs: Lab Results  Component Value Date   HGBA1C 5.1 12/31/2017   MPG 99.67 12/31/2017   No results found for: PROLACTIN Lab Results  Component Value Date   CHOL 83 12/31/2017   TRIG 42 12/31/2017   HDL 26 (L) 12/31/2017   CHOLHDL 3.2 12/31/2017   VLDL 8 12/31/2017   LDLCALC  49 12/31/2017    Physical Findings: AIMS: Facial and Oral Movements Muscles of Facial Expression: None, normal Lips and Perioral Area: None, normal Jaw: None, normal Tongue: None, normal,Extremity Movements Upper (arms, wrists, hands, fingers): None, normal Lower (legs, knees, ankles, toes): None, normal, Trunk Movements Neck, shoulders, hips: None, normal, Overall Severity Severity of abnormal movements (highest score from questions above): None, normal Incapacitation due to abnormal movements: None, normal Patient's awareness of abnormal movements (rate only patient's report): No Awareness, Dental Status Current problems with teeth and/or dentures?: Yes Does patient usually wear dentures?: No  CIWA:    COWS:  COWS Total Score: 7  Musculoskeletal: Strength & Muscle Tone: within normal limits Gait & Station: normal Patient leans: N/A  Psychiatric Specialty Exam: Physical Exam  Review of Systems  Psychiatric/Behavioral: Positive for depression.  All other systems reviewed and are negative.   Blood pressure 114/75, pulse 75, temperature (!) 97.4 F (36.3 C), temperature source Oral,  resp. rate 20, height 6' (1.829 m), weight 86.2 kg (190 lb).Body mass index is 25.77 kg/m.  General Appearance: Casual  Eye Contact:  Minimal  Speech:  Clear and Coherent and Normal Rate  Volume:  Normal  Mood:  Depressed  Affect:  Appropriate, Congruent and Depressed  Thought Process:  Coherent, Goal Directed, Linear and Descriptions of Associations: Intact  Orientation:  Full (Time, Place, and Person)  Thought Content:  Focused on medication management  Suicidal Thoughts:  No, and contracts for safety  Homicidal Thoughts:  No  Memory:  Immediate;   Fair Recent;   Fair Remote;   Fair  Judgement:  Fair  Insight:  Fair  Psychomotor Activity:  Normal  Concentration:  Concentration: Fair and Attention Span: Fair  Recall:  [employer] of Knowledge:  Fair  Language:  Fair  Akathisia:  No   Handed:    AIMS (if indicated):     Assets:  Communication Skills Desire for Improvement Resilience Social Support  ADL's:  Intact  Cognition:  WNL  Sleep:  Number of Hours: 4.5   Treatment Plan Summary: MDD (major depressive disorder), recurrent severe, without psychosis (HCC) unstable, managed as below: -Cocaine use disorder, mild, abuse, unstable -Methamphetamine dependence, unstable -Opiate dependence with intoxication with complication, unstable, managed as below  Medications:  -Titrate Zoloft to 50mg  PO QD for depression -Opiate COWS protocol with clonidine taper -Continue nicotine patch   Beau Fanny, FNP 12/31/2017, 4:02 PM

## 2017-12-31 NOTE — Progress Notes (Signed)
D: Patient observed isolative to room, bed. Patient states his day was "okay" however presents with flat, sad affect and sullen mood.   Denies pain however COWS is a "4."   A: Medicated per orders, no prns requested or required. Medication education provided. Level III obs in place for safety. Emotional support offered. Patient encouraged to complete Suicide Safety Plan before discharge. Encouraged to attend and participate in unit programming as patient has not been to groups today. Patient educated on fall prevention as he is a high fall risk.   R: Patient verbalizes understanding of POC, falls prevention education.  Patient denies SI/HI/AVH and remains safe on level III obs. Will continue to monitor throughout the night.

## 2017-12-31 NOTE — Progress Notes (Signed)
D:  Patient's self inventory sheet, patient sleeps good, sleep medication helpful.  Good appetite, low energy level, poor concentration.  Rated depression 8, hopeless 9, anxiety 2.  Denied withdrawals.  Denied SI.  Physical problems, feet, Worst pain #8 in past 24 hours.  Pain medication not helpful.  Goal is to sleep.  Plans to rest.  No discharge plans.   A:  Medications administered per MD orders.  Emotional support and encouragement given patient. R:  Denied SI and HI, contracts for safety.  Denied A/V hallucinations.  Safety maintained with 15 minute checks.

## 2018-01-01 LAB — HEPATITIS PANEL, ACUTE
HCV Ab: >11 s/co ratio — ABNORMAL HIGH (ref 0.0–0.9)
Hep A IgM: NEGATIVE
Hep B C IgM: POSITIVE — AB
Hepatitis B Surface Ag: NEGATIVE

## 2018-01-01 NOTE — Plan of Care (Signed)
D: Dwayne Alvarez says he is having near-constant thoughts of overdosing on heroin, which he suspects may be brought on by the upcoming anniversary of his son's mother's death. He contracts for safety while here. He has been anxious and isolative today, choosing not to attend groups. He is cooperative and polite.  A: Meds given as ordered, including PRN Vistaril. Q15 safety checks maintained. Support/encouragement offered.  R: Pt remains free from harm and continues with treatment. Will continue to monitor for needs/safety.   Problem: Education: Goal: Emotional status will improve Outcome: Not Progressing   Problem: Activity: Goal: Interest or engagement in activities will improve Outcome: Not Progressing   Problem: Coping: Goal: Ability to verbalize frustrations and anger appropriately will improve Outcome: Progressing Goal: Ability to demonstrate self-control will improve Outcome: Progressing   Problem: Physical Regulation: Goal: Ability to maintain clinical measurements within normal limits will improve Outcome: Progressing

## 2018-01-01 NOTE — Progress Notes (Signed)
Pt did not attend AA group this evening.  

## 2018-01-01 NOTE — Progress Notes (Signed)
Pt did not attend goals and orientation group this morning  

## 2018-01-01 NOTE — Tx Team (Signed)
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 acetaminophen (TYLENOL) tablet 650 mg  650 mg Oral Q6H PRN Truman HaywardStarkes, Takia S, FNP   650 mg at 12/30/17 1620  . alum & mag hydroxide-simeth (MAALOX/MYLANTA) 200-200-20 MG/5ML suspension 30 mL  30 mL Oral Q4H PRN Starkes, Takia S, FNP      . cloNIDine (CATAPRES) tablet 0.1 mg  0.1 mg Oral QID Antonieta Pertlary, Greg Lawson, MD   0.1 mg at 01/01/18 0836   Followed by  . [START ON 01/02/2018] cloNIDine (CATAPRES) tablet 0.1 mg  0.1 mg Oral BH-qamhs Clary, Marlane MingleGreg Lawson, MD       Followed by  . [START ON 01/04/2018] cloNIDine (CATAPRES) tablet 0.1 mg  0.1 mg Oral QAC breakfast Antonieta Pertlary, Greg Lawson, MD      . dicyclomine (BENTYL) tablet 20 mg  20 mg Oral Q6H PRN Antonieta Pertlary, Greg Lawson, MD      . hydrOXYzine (ATARAX/VISTARIL) tablet 25 mg  25 mg Oral Q6H PRN Antonieta Pertlary, Greg Lawson, MD   25 mg at 12/30/17 2336  . loperamide (IMODIUM) capsule 2-4 mg  2-4 mg Oral PRN Antonieta Pertlary, Greg Lawson, MD      . magnesium hydroxide (MILK OF MAGNESIA) suspension 30 mL  30 mL Oral Daily PRN Starkes, Takia S, FNP      . methocarbamol (ROBAXIN) tablet 500 mg  500 mg Oral Q8H PRN Antonieta Pertlary, Greg Lawson, MD   500 mg at 12/30/17 2336  . naproxen (NAPROSYN) tablet 500 mg  500 mg Oral BID PRN Antonieta Pertlary, Greg Lawson, MD      . nicotine (NICODERM CQ - dosed in mg/24 hours) patch 21 mg  21 mg Transdermal Q0600 Truman HaywardStarkes, Takia S, FNP   21 mg at  01/01/18 0836  . ondansetron (ZOFRAN-ODT) disintegrating tablet 4 mg  4 mg Oral Q6H PRN Antonieta Pertlary, Greg Lawson, MD      . sertraline (ZOLOFT) tablet 50 mg  50 mg Oral Daily Beau FannyWithrow, John C, FNP   50 mg at 01/01/18 [id number]   PTA Medications: Medications Prior to Admission  Medication Sig Dispense Refill Last Dose  . acetaminophen (TYLENOL) 500 MG tablet Take 500 mg by mouth daily as needed (foot pain).   Past Week at Unknown time  . cephALEXin (KEFLEX) 500 MG capsule Take 1 capsule (500 mg total) by mouth 4 (four) times daily. 28 capsule 0   . naproxen (NAPROSYN) 500 MG tablet Take 1 tablet (500 mg total) by mouth 2 (two) times daily. 30 tablet 0     Patient Stressors: Loss of son's mother Medication change or noncompliance Substance abuse  Patient Strengths: Ability for insight Average or above average intelligence Capable of independent living General fund of knowledge  Treatment Modalities: Medication Management, Group therapy, Case management,  1 to 1 session with clinician, Psychoeducation, Recreational therapy.   Physician Treatment Plan for Primary Diagnosis: MDD (major depressive disorder), recurrent severe, without psychosis (HCC) Long Term Goal(s): Improvement in symptoms so as ready for discharge Improvement in symptoms so as ready for discharge   Short  Term Goals: Ability to identify changes in lifestyle to reduce recurrence of condition will improve Ability to verbalize feelings will improve Ability to disclose and discuss suicidal ideas Ability to demonstrate self-control will improve Ability to identify and develop effective coping behaviors will improve Ability to maintain clinical measurements within normal limits will improve Ability to identify triggers associated with substance abuse/mental health issues will improve Ability to identify changes in lifestyle to reduce recurrence of condition will improve Ability to verbalize feelings will improve Ability to disclose  and discuss suicidal ideas Ability to demonstrate self-control will improve Ability to identify and develop effective coping behaviors will improve Ability to maintain clinical measurements within normal limits will improve  Medication Management: Evaluate patient's response, side effects, and tolerance of medication regimen.  Therapeutic Interventions: 1 to 1 sessions, Unit Group sessions and Medication administration.  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)  Long Term Goal(s): Improvement in symptoms so as ready for discharge Improvement in symptoms so as ready for discharge   Short Term Goals: Ability to identify changes in lifestyle to reduce recurrence of condition will improve Ability to verbalize feelings will improve Ability to disclose and discuss suicidal ideas Ability to demonstrate self-control will improve Ability to identify and develop effective coping behaviors will improve Ability to maintain clinical measurements within normal limits will improve Ability to identify triggers associated with substance abuse/mental health issues will improve Ability to identify changes in lifestyle to reduce recurrence of condition will improve Ability to verbalize feelings will improve Ability to disclose and discuss suicidal ideas Ability to demonstrate self-control will improve Ability to identify and develop effective coping behaviors will improve Ability to maintain clinical measurements within normal limits will improve     Medication Management: Evaluate patient's response, side effects, and tolerance of medication regimen.  Therapeutic Interventions: 1 to 1 sessions, Unit Group  sessions and Medication administration.  Evaluation of Outcomes: Progressing   RN Treatment Plan for Primary Diagnosis: MDD (major depressive disorder), recurrent severe, without psychosis (HCC) Long Term Goal(s): Knowledge of disease and therapeutic regimen to maintain health will improve  Short Term Goals: Ability to remain free from injury will improve, Ability to participate in decision making will improve and Ability to identify and develop effective coping behaviors will improve  Medication Management: RN will administer medications as ordered by provider, will assess and evaluate patient's response and provide education to patient for prescribed medication. RN will report any adverse and/or side effects to prescribing provider.  Therapeutic Interventions: 1 on 1 counseling sessions, Psychoeducation, Medication administration, Evaluate responses to treatment, Monitor vital signs and CBGs as ordered, Perform/monitor CIWA, COWS, AIMS and Fall Risk screenings as ordered, Perform wound care treatments as ordered.  Evaluation of Outcomes: Progressing   LCSW Treatment Plan for Primary Diagnosis: MDD (major depressive disorder), recurrent severe, without psychosis (HCC) Long Term Goal(s): Safe transition to appropriate next level of care at discharge, Engage patient in therapeutic group addressing interpersonal concerns.  Short Term Goals: Engage patient in aftercare planning with referrals and resources, Facilitate patient progression through stages of change regarding substance use diagnoses and concerns and Identify triggers associated with mental health/substance abuse issues  Therapeutic Interventions: Assess for all discharge needs, 1 to 1 time with Child psychotherapist, Explore  available resources and support systems, Assess for adequacy in community support network, Educate family and significant other(s) on suicide prevention, Complete Psychosocial Assessment, Interpersonal group  therapy.  Evaluation of Outcomes: Progressing   Progress in Treatment: Attending groups: Yes. Participating in groups: Yes. Taking medication as prescribed: Yes. Toleration medication: Yes. Family/Significant other contact made: SPE completed with pt; pt declined to consent to collateral contact.  Patient understands diagnosis: Yes. Discussing patient identified problems/goals with staff: Yes. Medical problems stabilized or resolved: Yes. Denies suicidal/homicidal ideation: Yes. Issues/concerns per patient self-inventory: No. Other: n/a   New problem(s) identified: No, Describe:  n/a  New Short Term/Long Term Goal(s): , medication management for mood stabilization; elimination of SI thoughts; development of comprehensive mental wellness/sobriety plan.   Patient Goals:  "to get linked with grief counseling and get help with my depression."   Discharge Plan or Barriers: CSW assessing. Pt lives at [facility] house and does not want to return due to "people using drugs and drinking there." Pt given [facility] list and plans to follow-up at [facility] MHAG pamphlet, Mobile Crisis information, and AA/NA information provided to patient for additional community support and resources.   Reason for Continuation of Hospitalization: Anxiety Depression Medication stabilization Suicidal ideation  Estimated Length of Stay: Monday, 01/04/18  Attendees: Patient: Dwayne Alvarez  01/01/2018 9:23 AM  Physician: Dr. Viviano Simas MD; Dr. Jola Babinski MD 01/01/2018 9:23 AM  Nursing: Dorrene German RN 01/01/2018 9:23 AM  RN Care Manager:x 01/01/2018 9:23 AM  Social Worker: Corrie Mckusick LCSW 01/01/2018 9:23 AM  Recreational Therapist: x 01/01/2018 9:23 AM  Other: Armandina Stammer NP 01/01/2018 9:23 AM  Other:  01/01/2018 9:23 AM  Other: 01/01/2018 9:23 AM    Scribe for Treatment Team: Rona Ravens, LCSW 01/01/2018 9:23 AM

## 2018-01-01 NOTE — [facility] Group Notes (Addendum)
[facility] Group Notes:  (Nursing/MHT/Case Management/Adjunct)  Date:  01/01/2018  Time:  1600  Type of Therapy:  Nurse Education  Participation Level:  Did not attend.  Summary of Progress/Problems: Gunnar did not participate in group despite invitation.  Maurine SimmeringShugart, [place] Welliver M 01/01/2018, 5:53 PM

## 2018-01-01 NOTE — Progress Notes (Signed)
[facility]  01/01/2018 3:38 PM Dwayne Alvarez  MRN:  [id number] Subjective: "I' still feel suicidal nothing has change."  Objective: Pt seen and chart reviewed. Pt is alert/oriented x4, calm, cooperative, and appropriate to situation. Pt denies homicidal ideation and psychosis and does not appear to be responding to internal stimuli. Pt states that he has felt very hopeless overall but is feeling slightly better knowing that he is getting help.We continue him on a Clonidine taper for detox and increase his Zoloft 50 mg by mouth for depression control.  The patient does endorse ongoing suicidal ideations he does not appear to want to act on them at this moment but he does report still having thoughts with plans to overdose.  He continues to ruminate about her upcoming date of his child's mother death.  He states that this is giving him much anxiety as the anniversary date approaches.  We will continue to monitor and adjust his level of observation as needed. He does express interest in going to a treatment program for substance abuse and social work will be exploring options with him.   Principal Problem: MDD (major depressive disorder), recurrent severe, without psychosis (HCC) Diagnosis:   Patient Active Problem List   Diagnosis Date Noted  . Substance induced mood disorder (HCC) [F19.94] 12/30/2017  . MDD (major depressive disorder), recurrent severe, without psychosis (HCC) [F33.2] 12/30/2017  . Suicidal ideation [R45.851]   . 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 Cocaine use disorder, mild, abuse (HCC) [F14.10]   . 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 Hypertension     Past Surgical History:  Procedure Laterality Date  . FOOT AMPUTATION THROUGH METATARSAL Right    Family History: History  reviewed. No pertinent family history. Family Psychiatric  History: see H&P Social History:  Social History   Substance and Sexual Activity  Alcohol Use Not Currently     Social History   Substance and Sexual Activity  Drug Use Yes  . Types: Methamphetamines, Cocaine   Comment: heroin    Social History   Socioeconomic History  . Marital status: Single    Spouse name: Not on file  . Number of children: Not on file  . Years of education: Not on file  . Highest education level: Not on file  Occupational History  . Not on file  Social Needs  . Financial resource strain: Not on file  . Food insecurity:    Worry: Not on file    Inability: Not on file  . Transportation needs:    Medical: Not on file    Non-medical: Not on file  Tobacco Use  . Smoking status: Current Every Day Smoker  . Smokeless tobacco: Never Used  Substance and Sexual Activity  . Alcohol use: Not Currently  . Drug use: Yes    Types: Methamphetamines, Cocaine    Comment: heroin  . Sexual activity: Not Currently  Lifestyle  . Physical activity:    Days per week: Not on file    Minutes per session: Not on file  . Stress: Not on file  Relationships  . Social connections:    Talks on phone: Not on file    Gets together: Not on file    Attends religious service: Not on file    Active member of club or organization:  Not on file    Attends meetings of clubs or organizations: Not on file    Relationship status: Not on file  Other Topics Concern  . Not on file  Social History Narrative  . Not on file   Additional Social History:                         Sleep: Fair  Appetite:  Fair  Current Medications: Current Facility-Administered Medications  Medication Dose Route Frequency Provider Last Rate Last Dose  . acetaminophen (TYLENOL) tablet 650 mg  650 mg Oral Q6H PRN Truman Hayward, FNP   650 mg at 12/30/17 1620  . alum & mag hydroxide-simeth (MAALOX/MYLANTA) 200-200-20 MG/5ML  suspension 30 mL  30 mL Oral Q4H PRN Starkes, Takia S, FNP      . cloNIDine (CATAPRES) tablet 0.1 mg  0.1 mg Oral QID Antonieta Pert, MD   0.1 mg at 01/01/18 1211   Followed by  . [START ON 01/02/2018] cloNIDine (CATAPRES) tablet 0.1 mg  0.1 mg Oral BH-qamhs Clary, Marlane Mingle, MD       Followed by  . [START ON 01/04/2018] cloNIDine (CATAPRES) tablet 0.1 mg  0.1 mg Oral QAC breakfast Antonieta Pert, MD      . dicyclomine (BENTYL) tablet 20 mg  20 mg Oral Q6H PRN Antonieta Pert, MD      . hydrOXYzine (ATARAX/VISTARIL) tablet 25 mg  25 mg Oral Q6H PRN Antonieta Pert, MD   25 mg at 01/01/18 1242  . loperamide (IMODIUM) capsule 2-4 mg  2-4 mg Oral PRN Antonieta Pert, MD      . magnesium hydroxide (MILK OF MAGNESIA) suspension 30 mL  30 mL Oral Daily PRN Starkes, Takia S, FNP      . methocarbamol (ROBAXIN) tablet 500 mg  500 mg Oral Q8H PRN Antonieta Pert, MD   500 mg at 12/30/17 2336  . naproxen (NAPROSYN) tablet 500 mg  500 mg Oral BID PRN Antonieta Pert, MD      . nicotine (NICODERM CQ - dosed in mg/24 hours) patch 21 mg  21 mg Transdermal Q0600 Truman Hayward, FNP   21 mg at 01/01/18 0836  . ondansetron (ZOFRAN-ODT) disintegrating tablet 4 mg  4 mg Oral Q6H PRN Antonieta Pert, MD      . sertraline (ZOLOFT) tablet 50 mg  50 mg Oral Daily Beau Fanny, FNP   50 mg at 01/01/18 1610    Lab Results:  Results for orders placed or performed during the hospital encounter of 12/30/17 (from the past 48 hour(s))  Hemoglobin A1c     Status: None   Collection Time: 12/31/17  6:39 AM  Result Value Ref Range   Hgb A1c MFr Bld 5.1 4.8 - 5.6 %    Comment: (NOTE) Pre diabetes:          5.7%-6.4% Diabetes:              >6.4% Glycemic control for   <7.0% adults with diabetes    Mean Plasma Glucose 99.67 mg/dL    Comment: Performed at [facility], [address] [place], [place] [address]  Lipid panel     Status: Abnormal   Collection Time: 12/31/17  6:39 AM   Result Value Ref Range   Cholesterol 83 0 - 200 mg/dL   Triglycerides 42 <409 mg/dL   HDL 26 (L) >81 mg/dL   Total CHOL/HDL Ratio  3.2 RATIO   VLDL 8 0 - 40 mg/dL   LDL Cholesterol 49 0 - 99 mg/dL    Comment:        Total Cholesterol/HDL:CHD Risk Coronary Heart Disease Risk Table                     Men   Women  1/2 Average Risk   3.4   3.3  Average Risk       5.0   4.4  2 X Average Risk   9.6   7.1  3 X Average Risk  23.4   11.0        Use the calculated Patient Ratio above and the CHD Risk Table to determine the patient's CHD Risk.        ATP III CLASSIFICATION (LDL):  <100     mg/dL   Optimal  478-295  mg/dL   Near or Above                    Optimal  130-159  mg/dL   Borderline  621-308  mg/dL   High  >657     mg/dL   Very High Performed at [facility], [address], [place], [place] [address]   TSH     Status: None   Collection Time: 12/31/17  6:39 AM  Result Value Ref Range   TSH 1.307 0.350 - 4.500 uIU/mL    Comment: Performed by a 3rd Generation assay with a functional sensitivity of <=0.01 uIU/mL. Performed at [facility], [address] [place], [place] [address]   Hepatitis panel, acute     Status: Abnormal   Collection Time: 12/31/17  6:39 AM  Result Value Ref Range   Hepatitis B Surface Ag Negative Negative   HCV Ab >11.0 (H) 0.0 - 0.9 s/co ratio    Comment: (NOTE)                                  Negative:     < 0.8                             Indeterminate: 0.8 - 0.9                                  Positive:     > 0.9 The CDC recommends that a positive HCV antibody result be followed up with a HCV Nucleic Acid Amplification test (413244). Performed At: [facility] [address] [place], [place] [id number] Jolene Schimke MD [id number]    Hep A IgM Negative Negative   Hep B C IgM Positive (A) Negative    Comment: Performed at [facility], [address] [place],  [place] [address]  Hepatic function panel     Status: Abnormal   Collection Time: 12/31/17  6:39 AM  Result Value Ref Range   Total Protein 7.4 6.5 - 8.1 g/dL   Albumin 3.3 (L) 3.5 - 5.0 g/dL   AST 643 (H) 15 - 41 U/L   ALT 155 (H) 0 - 44 U/L    Comment: Please note change in reference range.   Alkaline Phosphatase 51 38 - 126 U/L   Total Bilirubin 0.7 0.3 - 1.2 mg/dL   Bilirubin, Direct 0.2  0.0 - 0.2 mg/dL    Comment: Please note change in reference range.   Indirect Bilirubin 0.5 0.3 - 0.9 mg/dL    Comment: Performed at [facility], [address] [place], [place] [address]    Blood Alcohol level:  Lab Results  Component Value Date   ETH <10 12/30/2017    Metabolic Disorder Labs: Lab Results  Component Value Date   HGBA1C 5.1 12/31/2017   MPG 99.67 12/31/2017   No results found for: PROLACTIN Lab Results  Component Value Date   CHOL 83 12/31/2017   TRIG 42 12/31/2017   HDL 26 (L) 12/31/2017   CHOLHDL 3.2 12/31/2017   VLDL 8 12/31/2017   LDLCALC 49 12/31/2017    Physical Findings: AIMS: Facial and Oral Movements Muscles of Facial Expression: None, normal Lips and Perioral Area: None, normal Jaw: None, normal Tongue: None, normal,Extremity Movements Upper (arms, wrists, hands, fingers): None, normal Lower (legs, knees, ankles, toes): None, normal, Trunk Movements Neck, shoulders, hips: None, normal, Overall Severity Severity of abnormal movements (highest score from questions above): None, normal Incapacitation due to abnormal movements: None, normal Patient's awareness of abnormal movements (rate only patient's report): No Awareness, Dental Status Current problems with teeth and/or dentures?: Yes Does patient usually wear dentures?: No  CIWA:  CIWA-Ar Total: 1 COWS:  COWS Total Score: 2  Musculoskeletal: Strength & Muscle Tone: within normal limits Gait & Station: normal Patient leans: N/A  Psychiatric Specialty Exam: Physical Exam    Review of Systems  Psychiatric/Behavioral: Positive for depression.  All other systems reviewed and are negative.   Blood pressure 114/75, pulse 66, temperature 98.5 F (36.9 C), temperature source Oral, resp. rate 20, height 6' (1.829 m), weight 86.2 kg (190 lb).Body mass index is 25.77 kg/m.  General Appearance: Casual  Eye Contact:  Minimal  Speech:  Clear and Coherent and Normal Rate  Volume:  Normal  Mood:  Depressed  Affect:  Appropriate, Congruent and Depressed  Thought Process:  Coherent, Goal Directed, Linear and Descriptions of Associations: Intact  Orientation:  Full (Time, Place, and Person)  Thought Content:  Focused on medication management  Suicidal Thoughts:  Passive si, and contracts for safety  Homicidal Thoughts:  No  Memory:  Immediate;   Fair Recent;   Fair Remote;   Fair  Judgement:  Fair  Insight:  Fair  Psychomotor Activity:  Normal  Concentration:  Concentration: Fair and Attention Span: Fair  Recall:  [employer] of Knowledge:  Fair  Language:  Fair  Akathisia:  No  Handed:    AIMS (if indicated):     Assets:  Communication Skills Desire for Improvement Resilience Social Support  ADL's:  Intact  Cognition:  WNL  Sleep:  Number of Hours: 6.25   Treatment Plan Summary: MDD (major depressive disorder), recurrent severe, without psychosis (HCC) unstable, managed as below: -Cocaine use disorder, mild, abuse, unstable -Methamphetamine dependence, unstable -Opiate dependence with intoxication with complication, unstable, managed as below  Medications:  -continue Zoloft to 50mg  PO QD for depression -Opiate COWS protocol with clonidine taper -Continue nicotine patch   Truman Hayward, FNP 01/01/2018, 3:38 PM

## 2018-01-01 NOTE — Progress Notes (Signed)
D.  Pt has remained in bed so far this shift.  Pt appears in no distress, respirations even and unlabored.  A.  Will continue to monitor for Pt safety  R.  Pt remains safe on the unit.

## 2018-01-01 NOTE — Progress Notes (Signed)
Pt did get up shortly after 2200 and received snacks as well as his HS medication.  No complaints voiced at that time.  Will continue to monitor.

## 2018-01-01 NOTE — Progress Notes (Signed)
Recreation Therapy Notes  Date: 7.5.19 Time: 0930 Location: [address] Dayroom  Group Topic: Stress Management  Goal Area(s) Addresses:  Patient will verbalize importance of using healthy stress management.  Patient will identify positive emotions associated with healthy stress management.   Intervention: Stress Management  Activity :  Meditation.  LRT introduced the stress management technique of meditation.  LRT played a meditation on resilience that allowed patients to focus on being able to withstand obstacles that arise.  Patients were to listen and follow along as meditation played to engage in the activity.  Education:  Stress Management, Discharge Planning.   Education Outcome: Acknowledges edcuation/In group clarification offered/Needs additional education  Clinical Observations/Feedback: Pt did not attend group.     Dwayne Alvarez, LRT/CTRS         [name] 01/01/2018 11:12 AM 

## 2018-01-02 LAB — HIV-1 RNA, QUALITATIVE, TMA: HIV-1 RNA, Qualitative, TMA: NEGATIVE

## 2018-01-02 MED ORDER — NICOTINE 21 MG/24HR TD PT24
21.0000 mg | MEDICATED_PATCH | Freq: Every day | TRANSDERMAL | Status: DC
  Administered 2018-01-03 – 2018-01-04 (×2): 21 mg via TRANSDERMAL
  Filled 2018-01-02 (×4): qty 1

## 2018-01-02 NOTE — Progress Notes (Signed)
D.  Pt pleasant on approach, did not feel well enough to attend evening AA group.  Pt did get up for snacks and to take evening medication.  Pt observed interacting minimally but appropriately with peers on the unit.  Pt does endorse passive SI and states he would OD on heroin if discharged.  Pt does verbally agree to come to staff before doing harm to himself in the hospital setting.  Pt denies HI/AVH at this time.  A.  Support and encouragement offered, medication given as ordered  R.  Pt remains safe on the unit, will continue to monitor.

## 2018-01-02 NOTE — Progress Notes (Signed)
D:  Patient's self inventory sheet, patient sleeps good, sleep medication helpful.  Fair appetite, low energy level, poor concentration.  Rate depression and hopeless 6, anxiety 10.  Denied withdrawals.  SI, [facility], contracts for safety.  Plan is to OD on heroin after [facility].  Denied physical problems.  Does have problems with his feet, worst pain in past 24 hours is #6, pain medication helpful.  Goal is "get better"  No discharge plans. A:  Medications adminisyered per MD orders.  Emotional support and encouragement given patient. R:  Denied HI.  Denied A/V hallucinations.  Patient stated he does have SI thoughts [facility].  [facility], no [facility]. Safety maintained with 15 minute checks.  Patient ambulates in wheelchair.  Patient stays in bed most of the day.

## 2018-01-02 NOTE — [facility] Group Notes (Signed)
[facility] Group Notes:  (Nursing)  Date:  01/02/2018  Time:  1:15 PM  Type of Therapy:  Nurse Education  Participation Level:  Did Not Attend  Shela NevinValerie S Kinsey Cowsert 01/02/2018, 4:33 PM

## 2018-01-02 NOTE — Plan of Care (Signed)
Nurse discussed suicide thoughts with RN, anxiety, depression, coping skills with patient.

## 2018-01-02 NOTE — [facility] Group Notes (Signed)
LCSW Group Therapy Note  01/02/2018    10:30-11:30am   Type of Therapy and Topic:  Group Therapy: Anger and Coping Skills  Participation Level:  Did Not Attend   Description of Group:   In this group, patients learned how to recognize the physical, cognitive, emotional, and behavioral responses they have to anger-provoking situations.  They identified how they usually or often react when angered, and learned how healthy and unhealthy coping skills work initially, but the unhealthy ones stop working.   They analyzed how their frequently-chosen coping skill is possibly beneficial and how it is possibly unhelpful.  The group discussed a variety of healthier coping skills that could help in resolving the actual issues, as well as how to go about planning for the the possibility of future similar situations.  Therapeutic Goals: 1. Patients will identify one thing that makes them angry and how they feel emotionally and physically, what their thoughts are or tend to be in those situations, and what healthy or unhealthy coping mechanism they typically use 2. Patients will identify how their coping technique works for them, as well as how it works against them. 3. Patients will explore possible new behaviors to use in future anger situations. 4. Patients will learn that anger itself is normal and cannot be eliminated, and that healthier coping skills can assist with resolving conflict rather than worsening situations.  Summary of Patient Progress:  N/A  Therapeutic Modalities:   Cognitive Behavioral Therapy Motivation Interviewing  Miyanna Wiersma J Grossman-Orr  .  

## 2018-01-02 NOTE — Progress Notes (Signed)
[facility] Progress Note  01/02/2018 10:23 AM Dwayne PolingClint Alvarez  MRN:  [id number] Subjective:  Dwayne Alvarez seen resting in bedroom.  Presents flat, guarded and irritable.  Patient continues to endorse suicidal ideations however is able to contract for safety while on the unit.  Rates his depression a 10 out of 10 with 10 being the worst.  Reports "nobody wants to help me."  Patient did not elaborate on needs or concerns.  Patient was short and abrupt with responses.  Reports difficulty sleeping at night with slight improvement since his admission.  Denies attending group session.  Reports taking medications as prescribed and tolerating them well.  Denies substance abuse cravings.  Support encouragement and reassurance was provided.  History:Patient is a 52 year old male with a past psychiatric history significant for methamphetamine use disorder, cocaine use disorder and opiate use disorder.  Patient stated that he had been living in a halfway house in the [place] area recently.  He stated that even though he wanted to stay sober there were drugs in the facility.  His history over the last 8 months is quite eventful.  [place] and was using substances regularly.  He ended up overdosing and being [facility].    Principal Problem: MDD (major depressive disorder), recurrent severe, without psychosis (HCC) Diagnosis:   Patient Active Problem List   Diagnosis Date Noted  . Substance induced mood disorder (HCC) [F19.94] 12/30/2017  . MDD (major depressive disorder), recurrent severe, without psychosis (HCC) [F33.2] 12/30/2017  . Suicidal ideation [R45.851]   . 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 Cocaine use disorder, mild, abuse (HCC) [F14.10]   . 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 Hypertension     Past Surgical History:  Procedure Laterality Date  . FOOT AMPUTATION THROUGH METATARSAL Right    Family History: History reviewed. No pertinent family history. Family Psychiatric  History:  Social History:  Social History   Substance and Sexual Activity  Alcohol Use Not Currently     Social History   Substance and Sexual Activity  Drug Use Yes  . Types: Methamphetamines, Cocaine   Comment: heroin    Social History   Socioeconomic History  . Marital status: Single    Spouse name: Not on file  . Number of children: Not on file  . Years of education: Not on file  . Highest education level: Not on file  Occupational History  . Not on file  Social Needs  . Financial resource strain: Not on file  . Food insecurity:    Worry: Not on file    Inability: Not on file  . Transportation needs:    Medical: Not on file    Non-medical: Not on file  Tobacco Use  . Smoking status: Current Every Day Smoker  . Smokeless tobacco: Never Used  Substance and Sexual Activity  . Alcohol use: Not Currently  . Drug use: Yes    Types: Methamphetamines, Cocaine    Comment: heroin  . Sexual activity: Not Currently  Lifestyle  . Physical activity:    Days per week: Not on file    Minutes per session: Not on file  . Stress: Not on file  Relationships  . Social connections:    Talks on phone: Not on  file    Gets together: Not on file    Attends religious service: Not on file    Active member of club or organization: Not on file    Attends meetings of clubs or organizations: Not on file    Relationship status: Not on file  Other Topics Concern  . Not on file  Social History Narrative  . Not on file   Additional Social History:        Sleep: Fair  Appetite:  Fair  Current Medications: Current Facility-Administered Medications  Medication Dose Route Frequency Provider Last Rate Last Dose  . acetaminophen (TYLENOL) tablet 650 mg   650 mg Oral Q6H PRN Truman Hayward, FNP   650 mg at 12/30/17 1620  . alum & mag hydroxide-simeth (MAALOX/MYLANTA) 200-200-20 MG/5ML suspension 30 mL  30 mL Oral Q4H PRN Starkes, Takia S, FNP      . cloNIDine (CATAPRES) tablet 0.1 mg  0.1 mg Oral BH-qamhs Antonieta Pert, MD   0.1 mg at 01/02/18 0820   Followed by  . [START ON 01/04/2018] cloNIDine (CATAPRES) tablet 0.1 mg  0.1 mg Oral QAC breakfast Antonieta Pert, MD      . dicyclomine (BENTYL) tablet 20 mg  20 mg Oral Q6H PRN Antonieta Pert, MD      . hydrOXYzine (ATARAX/VISTARIL) tablet 25 mg  25 mg Oral Q6H PRN Antonieta Pert, MD   25 mg at 01/01/18 2206  . loperamide (IMODIUM) capsule 2-4 mg  2-4 mg Oral PRN Antonieta Pert, MD      . magnesium hydroxide (MILK OF MAGNESIA) suspension 30 mL  30 mL Oral Daily PRN Starkes, Takia S, FNP      . methocarbamol (ROBAXIN) tablet 500 mg  500 mg Oral Q8H PRN Antonieta Pert, MD   500 mg at 01/01/18 2206  . naproxen (NAPROSYN) tablet 500 mg  500 mg Oral BID PRN Antonieta Pert, MD      . nicotine (NICODERM CQ - dosed in mg/24 hours) patch 21 mg  21 mg Transdermal Q0600 Truman Hayward, FNP   21 mg at 01/02/18 0820  . ondansetron (ZOFRAN-ODT) disintegrating tablet 4 mg  4 mg Oral Q6H PRN Antonieta Pert, MD      . sertraline (ZOLOFT) tablet 50 mg  50 mg Oral Daily Withrow, John C, FNP   50 mg at 01/02/18 0820    Lab Results: No results found for this or any previous visit (from the past 48 hour(s)).  Blood Alcohol level:  Lab Results  Component Value Date   ETH <10 12/30/2017    Metabolic Disorder Labs: Lab Results  Component Value Date   HGBA1C 5.1 12/31/2017   MPG 99.67 12/31/2017   No results found for: PROLACTIN Lab Results  Component Value Date   CHOL 83 12/31/2017   TRIG 42 12/31/2017   HDL 26 (L) 12/31/2017   CHOLHDL 3.2 12/31/2017   VLDL 8 12/31/2017   LDLCALC 49 12/31/2017    Physical Findings: AIMS: Facial and Oral Movements Muscles of Facial  Expression: None, normal Lips and Perioral Area: None, normal Jaw: None, normal Tongue: None, normal,Extremity Movements Upper (arms, wrists, hands, fingers): None, normal Lower (legs, knees, ankles, toes): None, normal, Trunk Movements Neck, shoulders, hips: None, normal, Overall Severity Severity of abnormal movements (highest score from questions above): None, normal Incapacitation due to abnormal movements: None, normal Patient's awareness of abnormal movements (rate only patient's report): No Awareness, Dental Status Current problems with  teeth and/or dentures?: Yes Does patient usually wear dentures?: No  CIWA:  CIWA-Ar Total: 1 COWS:  COWS Total Score: 3  Musculoskeletal: Strength & Muscle Tone: within normal limits Gait & Station: unsteady wheelchair amputee Patient leans: N/A  Psychiatric Specialty Exam: Physical Exam  Vitals reviewed. Constitutional: He appears well-developed.  Neurological: He is alert.  Psychiatric: He has a normal mood and affect. His behavior is normal.    Review of Systems  Psychiatric/Behavioral: Positive for depression and substance abuse. The patient is nervous/anxious and has insomnia.   All other systems reviewed and are negative.   Blood pressure 115/70, pulse 63, temperature 98.5 F (36.9 C), temperature source Oral, resp. rate 20, height 6' (1.829 m), weight 86.2 kg (190 lb).Body mass index is 25.77 kg/m.  General Appearance: Disheveled and Guarded  Eye Contact:  Minimal  Speech:  Clear and Coherent and Pressured  Volume:  Normal  Mood:  Depressed and Irritable  Affect:  Depressed  Thought Process:  Coherent  Orientation:  Full (Time, Place, and Person)  Thought Content:  Logical  Suicidal Thoughts:  Yes.  without intent/plan  Homicidal Thoughts:  No  Memory:  Immediate;   Fair Recent;   Fair Remote;   Fair  Judgement:  Fair  Insight:  Lacking  Psychomotor Activity:  Normal  Concentration:  Concentration: Fair  Recall:  [employer] of Knowledge:  Fair  Language:  Fair  Akathisia:  No  Handed:  Right  AIMS (if indicated):     Assets:  Communication Skills Desire for Improvement Resilience Social Support  ADL's:  Intact  Cognition:  WNL  Sleep:  Number of Hours: 4.25     Treatment Plan Summary: Daily contact with patient to assess and evaluate symptoms and progress in treatment and Medication management   Continue current treatment plan on 01/02/2018 as listed below except were noted  Continue opiate detox protocol [facility]  Depression:  Continue Zoloft 50 mg p.o. Daily  CSW to continue working on discharge disposition Encourage patient to attend daily group sessions with active engagement in the milieu   Oneta Rack, NP 01/02/2018, 10:23 AM

## 2018-01-02 NOTE — Progress Notes (Signed)
Patient did not attend the evening speaker AA meeting. Pt was notified that group was beginning but remained in bed.   

## 2018-01-03 MED ORDER — RISPERIDONE 0.5 MG PO TABS
0.5000 mg | ORAL_TABLET | Freq: Two times a day (BID) | ORAL | Status: DC | PRN
  Administered 2018-01-03: 0.5 mg via ORAL
  Filled 2018-01-03: qty 1

## 2018-01-03 MED ORDER — BUSPIRONE HCL 5 MG PO TABS
5.0000 mg | ORAL_TABLET | Freq: Two times a day (BID) | ORAL | Status: DC
  Administered 2018-01-03 – 2018-01-04 (×2): 5 mg via ORAL
  Filled 2018-01-03 (×7): qty 1

## 2018-01-03 NOTE — Progress Notes (Signed)
[facility] MD Progress Note  01/03/2018 1:52 PM Dwayne Alvarez  MRN:  [id number] Subjective:  Dwayne Alvarez seen resting in bedroom.  Continues to present flat, guarded, irritable and disheveled.  " I feel  the same as yesterday, I don't know why y'all keep asking the same asquestions"  patient continues to endorse suicidal ideations without intent or plan.  Dantonio is is able to contract for safety while on the unit.  Reports and rates his depression 8/10 with 10 being the worst.  Reports taking medication and tolerating well denies medication side effects i.e. Headaches, nausea, vomiting or dizziness.  States he has not attended any group sessions since his admission.  Patient is a current to participate throughout the milieu.  Support encouragement and reassurance was provided.  History:Patient is a 52 year old male with a past psychiatric history significant for methamphetamine use disorder, cocaine use disorder and opiate use disorder.  Patient stated that he had been living in a halfway house in the [place] area recently.  He stated that even though he wanted to stay sober there were drugs in the facility.  His history over the last 8 months is quite eventful.  He has been living in [facility] and was using substances regularly.  He ended up overdosing and being admitted in the emergency department at [facility].    Principal Problem: MDD (major depressive disorder), recurrent severe, without psychosis (HCC) Diagnosis:   Patient Active Problem List   Diagnosis Date Noted  . Substance induced mood disorder (HCC) [F19.94] 12/30/2017  . MDD (major depressive disorder), recurrent severe, without psychosis (HCC) [F33.2] 12/30/2017  . Suicidal ideation [R45.851]   . 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 Cocaine use disorder, mild, abuse (HCC) [F14.10]   . Opioid dependence with intoxication with complication (HCC) [F11.229]    Total Time spent with patient: 30 minutes  Past Psychiatric  History:   Past Medical History:  Past Medical History:  Diagnosis Date  . Hypertension     Past Surgical History:  Procedure Laterality Date  . FOOT AMPUTATION THROUGH METATARSAL Right    Family History: History reviewed. No pertinent family history. Family Psychiatric  History:  Social History:  Social History   Substance and Sexual Activity  Alcohol Use Not Currently     Social History   Substance and Sexual Activity  Drug Use Yes  . Types: Methamphetamines, Cocaine   Comment: heroin    Social History   Socioeconomic History  . Marital status: Single    Spouse name: Not on file  . Number of children: Not on file  . Years of education: Not on file  . Highest education level: Not on file  Occupational History  . Not on file  Social Needs  . Financial resource strain: Not on file  . Food insecurity:    Worry: Not on file    Inability: Not on file  . Transportation needs:    Medical: Not on file    Non-medical: Not on file  Tobacco Use  . Smoking status: Current Every Day Smoker  . Smokeless tobacco: Never Used  Substance and Sexual Activity  . Alcohol use: Not Currently  . Drug use: Yes    Types: Methamphetamines, Cocaine    Comment: heroin  . Sexual activity: Not Currently  Lifestyle  . Physical activity:    Days per week: Not on file    Minutes per session: Not on file  . Stress: Not on file  Relationships  . Social  connections:    Talks on phone: Not on file    Gets together: Not on file    Attends religious service: Not on file    Active member of club or organization: Not on file    Attends meetings of clubs or organizations: Not on file    Relationship status: Not on file  Other Topics Concern  . Not on file  Social History Narrative  . Not on file   Additional Social History:        Sleep: Fair  Appetite:  Fair  Current Medications: Current Facility-Administered Medications  Medication Dose Route Frequency Provider Last Rate Last  Dose  . acetaminophen (TYLENOL) tablet 650 mg  650 mg Oral Q6H PRN Truman HaywardStarkes, Takia S, FNP   650 mg at 01/03/18 1246  . alum & mag hydroxide-simeth (MAALOX/MYLANTA) 200-200-20 MG/5ML suspension 30 mL  30 mL Oral Q4H PRN Starkes, Takia S, FNP      . cloNIDine (CATAPRES) tablet 0.1 mg  0.1 mg Oral BH-qamhs Antonieta Pertlary, Greg Lawson, MD   0.1 mg at 01/03/18 0830   Followed by  . [START ON 01/04/2018] cloNIDine (CATAPRES) tablet 0.1 mg  0.1 mg Oral QAC breakfast Antonieta Pertlary, Greg Lawson, MD      . dicyclomine (BENTYL) tablet 20 mg  20 mg Oral Q6H PRN Antonieta Pertlary, Greg Lawson, MD      . hydrOXYzine (ATARAX/VISTARIL) tablet 25 mg  25 mg Oral Q6H PRN Antonieta Pertlary, Greg Lawson, MD   25 mg at 01/03/18 1205  . loperamide (IMODIUM) capsule 2-4 mg  2-4 mg Oral PRN Antonieta Pertlary, Greg Lawson, MD      . magnesium hydroxide (MILK OF MAGNESIA) suspension 30 mL  30 mL Oral Daily PRN Starkes, Takia S, FNP      . methocarbamol (ROBAXIN) tablet 500 mg  500 mg Oral Q8H PRN Antonieta Pertlary, Greg Lawson, MD   500 mg at 01/02/18 2116  . naproxen (NAPROSYN) tablet 500 mg  500 mg Oral BID PRN Antonieta Pertlary, Greg Lawson, MD      . nicotine (NICODERM CQ - dosed in mg/24 hours) patch 21 mg  21 mg Transdermal Daily Cobos, Rockey SituFernando A, MD   21 mg at 01/03/18 0830  . ondansetron (ZOFRAN-ODT) disintegrating tablet 4 mg  4 mg Oral Q6H PRN Antonieta Pertlary, Greg Lawson, MD      . sertraline (ZOLOFT) tablet 50 mg  50 mg Oral Daily Withrow, John C, FNP   50 mg at 01/03/18 0830    Lab Results: No results found for this or any previous visit (from the past 48 hour(s)).  Blood Alcohol level:  Lab Results  Component Value Date   ETH <10 12/30/2017    Metabolic Disorder Labs: Lab Results  Component Value Date   HGBA1C 5.1 12/31/2017   MPG 99.67 12/31/2017   No results found for: PROLACTIN Lab Results  Component Value Date   CHOL 83 12/31/2017   TRIG 42 12/31/2017   HDL 26 (L) 12/31/2017   CHOLHDL 3.2 12/31/2017   VLDL 8 12/31/2017   LDLCALC 49 12/31/2017    Physical Findings: AIMS:  Facial and Oral Movements Muscles of Facial Expression: None, normal Lips and Perioral Area: None, normal Jaw: None, normal Tongue: None, normal,Extremity Movements Upper (arms, wrists, hands, fingers): None, normal Lower (legs, knees, ankles, toes): None, normal, Trunk Movements Neck, shoulders, hips: None, normal, Overall Severity Severity of abnormal movements (highest score from questions above): None, normal Incapacitation due to abnormal movements: None, normal Patient's awareness of abnormal movements (rate only  patient's report): No Awareness, Dental Status Current problems with teeth and/or dentures?: Yes Does patient usually wear dentures?: No  CIWA:  CIWA-Ar Total: 1 COWS:  COWS Total Score: 1  Musculoskeletal: Strength & Muscle Tone: within normal limits Gait & Station: unsteady wheelchair amputee Patient leans: N/A  Psychiatric Specialty Exam: Physical Exam  Vitals reviewed. Constitutional: He appears well-developed.  Neurological: He is alert.  Psychiatric: He has a normal mood and affect. His behavior is normal.    Review of Systems  Psychiatric/Behavioral: Positive for depression and substance abuse. The patient is nervous/anxious and has insomnia.   All other systems reviewed and are negative.   Blood pressure 108/67, pulse (!) 53, temperature 98.5 F (36.9 C), temperature source Oral, resp. rate 20, height 6' (1.829 m), weight 86.2 kg (190 lb).Body mass index is 25.77 kg/m.  General Appearance: Disheveled and Guarded  Eye Contact:  Minimal  Speech:  Clear and Coherent and Pressured  Volume:  Normal  Mood:  Depressed and Irritable  Affect:  Depressed  Thought Process:  Coherent  Orientation:  Full (Time, Place, and Person)  Thought Content:  Logical  Suicidal Thoughts:  Yes.  without intent/plan passive ideation. Patient is able to contract for safety   Homicidal Thoughts:  No  Memory:  Immediate;   Fair Recent;   Fair Remote;   Fair  Judgement:  Fair   Insight:  Lacking  Psychomotor Activity:  Normal  Concentration:  Concentration: Fair  Recall:  [employer] of Knowledge:  Fair  Language:  Fair  Akathisia:  No  Handed:  Right  AIMS (if indicated):     Assets:  Communication Skills Desire for Improvement Resilience Social Support  ADL's:  Intact  Cognition:  WNL  Sleep:  Number of Hours: 4.75     Treatment Plan Summary: Daily contact with patient to assess and evaluate symptoms and progress in treatment and Medication management   Continue current treatment plan on 01/03/2018 as listed below except were noted  Continue opiate detox protocol [facility]  Depression:  Continue Zoloft 50 mg p.o. Daily  CSW to continue working on discharge disposition Encourage patient to attend daily group sessions with active engagement in the milieu  Oneta Rack, NP 01/03/2018, 1:52 PM

## 2018-01-03 NOTE — Progress Notes (Signed)
Patient did attend the evening speaker AA meeting.  

## 2018-01-03 NOTE — [facility] Group Notes (Signed)
[facility] Group Notes:  (Nursing)  Date:  01/03/2018  Time: 1:15 PM Type of Therapy:  Nurse Education  Participation Level:  Did Not Attend     Shela NevinValerie S Nyara Alvarez 01/03/2018, 6:06 PM

## 2018-01-03 NOTE — Progress Notes (Signed)
D:  Patient denied HI.  SI, [facility].  Stated he has been hearing voices and seeing pictures, flashing lights, etc. A:  Medications administered per MD orders.  Emotional support and encouragement given patient. R:  Safety maintained with 15 minute checks.

## 2018-01-03 NOTE — Progress Notes (Signed)
D.  Pt anxious on approach, complaint of of audio and visual hallucinations.  Pt states "they told me to let you know if I'm still having them".  Pt did not feel able to attend evening AA group, minimal interaction on unit.  Pt denies SI/HI.  A.  Support and encouragement offered, Risperdal given as ordered for hallucinations.  R.  Pt remains safe on the unit, will continue to monitor.

## 2018-01-03 NOTE — Progress Notes (Signed)
Patient stated that he has been hearing voices for 4 days and the voices are getting louder.  Patient stated he denied A/V hallucinations because he did not want to put in rubber room.  Buspar 5 mg given patient.

## 2018-01-03 NOTE — [facility] Group Notes (Signed)
[facility] Group Notes: (Clinical Social Work)   01/03/2018      Type of Therapy:  Group Therapy   Participation Level:  Did Not Attend despite MHT prompting   Shellia CleverlyStephanie N Jerni Selmer, LCSW  01/03/2018 11:45 AM

## 2018-01-03 NOTE — Plan of Care (Signed)
Nurse discussed depression, anxiety, coping skills with patient.  

## 2018-01-04 MED ORDER — HYDROXYZINE HCL 25 MG PO TABS: 25.0000 mg | ORAL_TABLET | Freq: Four times a day (QID) | ORAL | 0 refills | Status: DC | PRN

## 2018-01-04 MED ORDER — SERTRALINE HCL 50 MG PO TABS: 50.0000 mg | ORAL_TABLET | Freq: Every day | ORAL | 0 refills | Status: DC

## 2018-01-04 MED ORDER — BUSPIRONE HCL 5 MG PO TABS: 5.0000 mg | ORAL_TABLET | Freq: Two times a day (BID) | ORAL | 0 refills | Status: DC

## 2018-01-04 NOTE — Progress Notes (Signed)
Discharge Note:  Patient discharged with bus ticket.  Patient denied SI and HI.  Denied A/V hallucinations.  Suicide prevention information given and discussed with patient who stated he understood and had no questions.  Patient stated he received all his belongings.  Patient stated he [facility].  All required discharge information given to patient at discharge.

## 2018-01-04 NOTE — Progress Notes (Signed)
  [facility] Discharge Plan :  Will you be returning to the same living situation after discharge:  Yes,  home --[facility] At discharge, do you have transportation home?: Yes,  bus pass provided Do you have the ability to pay for your medications: Yes,  L[employer] medicaid ([employer])  Release of information consent forms completed and submitted to medical records by CSW.   Patient to Follow up at: Follow-up Information    [facility] Follow up on 01/07/2018.   Specialty:  Behavioral Health Why:  Hospital follow-up on Thursday, 7/11 at 8:15AM. Please bring: photo ID, medicaid card, and medications. Thank you.  Contact information: [address] [place] [address] [phone]           Next level of care provider has access to [facility] Link:yes  Safety Planning and Suicide Prevention discussed: Yes,  SPE completed with pt; pt declined to consent to collateral contact.   Have you used any form of tobacco in the last 30 days? (Cigarettes, Smokeless Tobacco, Cigars, and/or Pipes): Yes  Has patient been referred to the Quitline?: Patient refused referral  Patient has been referred for addiction treatment: Yes  Rona RavensHeather S Damarcus Reggio, LCSW 01/04/2018, 11:38 AM

## 2018-01-04 NOTE — BHH Suicide Risk Assessment (Signed)
[facility]   Principal Problem: MDD (major depressive disorder), recurrent severe, without psychosis (HCC) Discharge Diagnoses:  Patient Active Problem List   Diagnosis Date Noted  . Substance induced mood disorder (HCC) [F19.94] 12/30/2017  . MDD (major depressive disorder), recurrent severe, without psychosis (HCC) [F33.2] 12/30/2017  . Suicidal ideation [R45.851]   . 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 Cocaine use disorder, mild, abuse (HCC) [F14.10]   . Opioid dependence with intoxication with complication (HCC) [F11.229]     Total Time spent with patient: 30 minutes  Musculoskeletal: Strength & Muscle Tone: within normal limits Gait & Station: in wheel chair Patient leans: N/A  Psychiatric Specialty Exam: Review of Systems  Musculoskeletal: Positive for joint pain.  All other systems reviewed and are negative.   Blood pressure 107/71, pulse (!) 52, temperature 97.8 F (36.6 C), temperature source Oral, resp. rate 16, height 6' (1.829 m), weight 86.2 kg (190 lb).Body mass index is 25.77 kg/m.  General Appearance: Casual  Eye Contact::  Fair  Speech:  Normal Rate409  Volume:  Normal  Mood:  Anxious  Affect:  Congruent  Thought Process:  Coherent  Orientation:  Full (Time, Place, and Person)  Thought Content:  Logical  Suicidal Thoughts:  No  Homicidal Thoughts:  No  Memory:  Immediate;   Fair Recent;   Fair Remote;   Fair  Judgement:  Intact  Insight:  Fair  Psychomotor Activity:  Normal  Concentration:  Fair  Recall:  Fis[employer]Fair  Fund of Knowledge:Fair  Language: Fair  Akathisia:  Negative  Handed:  Right  AIMS (if indicated):     Assets:  Communication Skills Desire for Improvement Housing Resilience  Sleep:  Number of Hours: 4  Cognition: WNL  ADL's:  Intact   Mental Status Per Nursing Assessment::   On Admission:  Suicidal ideation indicated by patient, Self-harm thoughts  Demographic Factors:  Male, Divorced or  widowed, Caucasian, Low socioeconomic status and Unemployed  Loss Factors: NA  Historical Factors: Impulsivity  Risk Reduction Factors:   NA  Continued Clinical Symptoms:  Alcohol/Substance Abuse/Dependencies  Cognitive Features That Contribute To Risk:  None    Suicide Risk:  Minimal: No identifiable suicidal ideation.  Patients presenting with no risk factors but with morbid ruminations; may be classified as minimal risk based on the severity of the depressive symptoms  Follow-up Information    [facility] Follow up on 01/07/2018.   Specialty:  Behavioral Health Why:  Hospital follow-up on Thursday, 7/11 at 8:15AM. Please bring: photo ID, medicaid card, and medications. Thank you.  Contact information: [address] [place] [address] [phone]           Plan Of Care/Follow-up recommendations:  Activity:  ad lib  Antonieta PertGreg Lawson Motty Borin, MD 01/04/2018, 11:13 AM

## 2018-01-04 NOTE — Discharge Summary (Signed)
Physician Discharge Summary Note  Patient:  Dwayne Alvarez is an 52 y.o., male MRN:  [id number] DOB:  05-26-1966 Patient phone:  [phone] (home)  Patient address:   [address] [place] [address],  Total Time spent with patient: 20 minutes  Date of Admission:  12/30/2017 Date of Discharge: 01/04/2018  Reason for Admission:  Patient is seen and examined.  Patient is a 52 year old male with a past psychiatric history significant for methamphetamine use disorder, cocaine use disorder and opiate use disorder.  Patient stated that he had been living in a halfway house in the [place] area recently.  He stated that even though he wanted to stay sober there were drugs in the facility.  His history over the last 8 months is quite eventful.  [place] and was using substances regularly.  He ended up overdosing and being admitted in [facility].  Once he woke up from the overdose he left.  [place] [facility] and abuse substances.  Something over the next several months occurred and he decided to seek assistance.  Over the last several months since then he has been in several cities and [place] assistance.  He had a parole violation where he was in jail for a week [place].  After then he drove over thousand miles looking for treatment facilities.  [place] in [place].  Most recently been in a halfway house where he currently resides.  He left that facility on July 1.  He had been sober since sometime in March.  When he left the facility he relapsed on heroin, cocaine and methamphetamines.  He stated he stayed in a hotel one night, and then was essentially homeless.  He stated he is depressed right now on his suicidal because the fact that his wife was murdered by someone.  He stated that the  person got off for this.  They have a young child.  He lives somewhere around this area.  He wanted to establish a relationship with the child.  That has not gone well.  He admitted to helplessness, hopelessness and worthlessness.  He admitted to having hepatitis C.  His drug screen was positive for cocaine, methamphetamines as well as opiates.  He was admitted to the hospital for evaluation and stabilization. Associated Signs/Symptoms: Depression Symptoms:  depressed mood, anhedonia, insomnia, psychomotor agitation, fatigue, feelings of worthlessness/guilt, difficulty concentrating, hopelessness, suicidal thoughts without plan, anxiety, panic attacks, loss of energy/fatigue, disturbed sleep, weight loss, (Hypo) Manic Symptoms:  Impulsivity, Irritable Mood, Anxiety Symptoms:  Excessive Worry, Psychotic Symptoms:  Denied PTSD Symptoms: Negative   Past Psychiatric History: Patient denied any psychiatric hospitalizations for anything other than substance related issues.  Principal Problem: MDD (major depressive disorder), recurrent severe, without psychosis [facility](HCC) Discharge Diagnoses: Patient Active Problem List   Diagnosis Date Noted  . Substance induced mood disorder (HCC) [F19.94] 12/30/2017  . MDD (major depressive disorder), recurrent severe, without psychosis (HCC) [F33.2] 12/30/2017  . Suicidal ideation [R45.851]   . 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 Cocaine use disorder, mild, abuse (HCC) [F14.10]   . Opioid dependence with intoxication with complication (HCC) [I69.629][F11.229]     Past Medical History:  Past Medical History:  Diagnosis Date  . Hypertension     Past Surgical History:  Procedure Laterality Date  . FOOT AMPUTATION THROUGH  METATARSAL Right    Family History: History reviewed. No pertinent family history. Family Psychiatric  History: denied Social History:  Social History   Substance and Sexual Activity  Alcohol Use Not Currently     Social  History   Substance and Sexual Activity  Drug Use Yes  . Types: Methamphetamines, Cocaine   Comment: heroin    Social History   Socioeconomic History  . Marital status: Single    Spouse name: Not on file  . Number of children: Not on file  . Years of education: Not on file  . Highest education level: Not on file  Occupational History  . Not on file  Social Needs  . Financial resource strain: Not on file  . Food insecurity:    Worry: Not on file    Inability: Not on file  . Transportation needs:    Medical: Not on file    Non-medical: Not on file  Tobacco Use  . Smoking status: Current Every Day Smoker  . Smokeless tobacco: Never Used  Substance and Sexual Activity  . Alcohol use: Not Currently  . Drug use: Yes    Types: Methamphetamines, Cocaine    Comment: heroin  . Sexual activity: Not Currently  Lifestyle  . Physical activity:    Days per week: Not on file    Minutes per session: Not on file  . Stress: Not on file  Relationships  . Social connections:    Talks on phone: Not on file    Gets together: Not on file    Attends religious service: Not on file    Active member of club or organization: Not on file    Attends meetings of clubs or organizations: Not on file    Relationship status: Not on file  Other Topics Concern  . Not on file  Social History Narrative  . Not on file    Hospital Course: Dwayne Alvarez was admitted for MDD (major depressive disorder), recurrent severe, without psychosis (HCC) and crisis management.  He was treated with the following medications Zoloft 50mg  po daily for depression and Buspar 15mg  po TID for anxiety. PRN medications were administered per detox protocol.   Dwayne Alvarez was discharged with current medication and was instructed on how to take medications as prescribed; (details listed below under Medication List).  Medical problems were identified and treated as needed.  Home medications were restarted as appropriate. Patient  with elevated liver enzmyes upon admissions, and decreased albumin.  Patient does have a history of hepatitis C, and UDS positive for amphetamines, opiates, and cocaine.  Improvement was monitored by observation and Dwayne Alvarez daily report of symptom reduction.  Emotional and mental status was monitored by daily self-inventory reports completed by Dwayne Alvarez and clinical staff.         Dwayne Alvarez was evaluated by the treatment team for stability and plans for continued recovery upon discharge.  Dwayne Alvarez motivation was an integral factor for scheduling further treatment.  Employment, transportation, bed availability, health status, family support, and any pending legal issues were also considered during his hospital stay.  He was offered further treatment options upon discharge including but not limited to Residential, Intensive Outpatient, and Outpatient treatment.  Dwayne Alvarez will follow up with the services as listed below under Follow Up Information.     Upon completion of this admission the Dwayne Alvarez was both mentally and medically stable for discharge denying suicidal/homicidal ideation, auditory/visual/tactile hallucinations, delusional thoughts and paranoia.  Physical Findings: AIMS: Facial and Oral Movements Muscles of Facial Expression: None, normal Lips and Perioral Area: None, normal Jaw: None, normal Tongue: None, normal,Extremity Movements Upper (arms, wrists, hands, fingers): None, normal Lower (legs, knees, ankles, toes): None, normal, Trunk Movements Neck, shoulders, hips: None, normal, Overall Severity Severity of abnormal movements (highest score from questions above): None, normal Incapacitation due to abnormal movements: None, normal Patient's awareness of abnormal movements (rate only patient's report): No Awareness, Dental Status Current problems with teeth and/or dentures?: Yes Does patient usually wear dentures?: No  CIWA:  CIWA-Ar Total: 1 COWS:   COWS Total Score: 1  Musculoskeletal: Strength & Muscle Tone: within normal limits Gait & Station: normal Patient leans: N/A  Psychiatric Specialty Exam: Physical Exam  ROS  Blood pressure 111/70, pulse (!) 59, temperature 97.8 F (36.6 C), temperature source Oral, resp. rate 16, height 6' (1.829 m), weight 86.2 kg (190 lb).Body mass index is 25.77 kg/m.  Sleep:  Number of Hours: 4     Have you used any form of tobacco in the last 30 days? (Cigarettes, Smokeless Tobacco, Cigars, and/or Pipes): Yes  Has this patient used any form of tobacco in the last 30 days? (Cigarettes, Smokeless Tobacco, Cigars, and/or Pipes)  No  Blood Alcohol level:  Lab Results  Component Value Date   ETH <10 12/30/2017    Metabolic Disorder Labs:  Lab Results  Component Value Date   HGBA1C 5.1 12/31/2017   MPG 99.67 12/31/2017   No results found for: PROLACTIN Lab Results  Component Value Date   CHOL 83 12/31/2017   TRIG 42 12/31/2017   HDL 26 (L) 12/31/2017   CHOLHDL 3.2 12/31/2017   VLDL 8 12/31/2017   LDLCALC 49 12/31/2017    See Psychiatric Specialty Exam and Suicide Risk Assessment completed by Attending Physician prior to discharge.  Discharge destination:  Home  Is patient on multiple antipsychotic therapies at discharge:  No   Has Patient had three or more failed trials of antipsychotic monotherapy by history:  No  Recommended Plan for Multiple Antipsychotic Therapies: NA  Discharge Instructions    Discharge instructions   Complete by:  As directed    Please continue to take medications as directed. If your symptoms return, worsen, or persist please call your 911, report to local ER, or contact crisis hotline. Please do not drink alcohol or use any illegal substances while taking prescription medications.     Allergies as of 01/04/2018   No Known Allergies     Medication List    STOP taking these medications   cephALEXin 500 MG capsule Commonly known as:  KEFLEX      TAKE these medications     Indication  acetaminophen 500 MG tablet Commonly known as:  TYLENOL Take 500 mg by mouth daily as needed (foot pain).  Indication:  Pain   busPIRone 5 MG tablet Commonly known as:  BUSPAR Take 1 tablet (5 mg total) by mouth 2 (two) times daily.  Indication:  Anxiety Disorder, Major Depressive Disorder   hydrOXYzine 25 MG tablet Commonly known as:  ATARAX/VISTARIL Take 1 tablet (25 mg total) by mouth every 6 (six) hours as needed for anxiety.  Indication:  Feeling Anxious   naproxen 500 MG tablet Commonly known as:  NAPROSYN Take 1 tablet (500 mg total) by mouth 2 (two) times daily.  Indication:  Pain   sertraline 50 MG tablet Commonly known as:  ZOLOFT Take 1 tablet (50 mg total) by mouth daily. Start taking  on:  01/05/2018  Indication:  Major Depressive Disorder      Follow-up Information    [facility] Follow up on 01/07/2018.   Specialty:  Behavioral Health Why:  Hospital follow-up on Thursday, 7/11 at 8:15AM. Please bring: photo ID, medicaid card, and medications. Thank you.  [address] [place] [address] [phone]           Follow-up recommendations:  Activity:  Increase activity as tolerated Diet:  Routine diet as suggested by outpatient physician. Tests:  Routine labs to include hepatic function panel.  Liver enzymes were elevated on admission will need to follow-up with hepatologist or primary care. Other:  Even if you began to feel better continue taking her medication.    Signed: Truman Hayward, FNP 01/04/2018, 12:24 PM

## 2018-01-04 NOTE — Progress Notes (Signed)
Recreation Therapy Notes  Date: 7.8.19 Time: 0930 Location: [address] Dayroom  Group Topic: Stress Management  Goal Area(s) Addresses:  Patient will verbalize importance of using healthy stress management.  Patient will identify positive emotions associated with healthy stress management.   Intervention: Stress Management  Activity :  Meditation.  LRT introduced the stress management technique of meditation.  LRT played a meditation on choice.  Patients were to listen and follow along as meditation played.  Education:  Stress Management, Discharge Planning.   Education Outcome: Acknowledges edcuation/In group clarification offered/Needs additional education  Clinical Observations/Feedback:  Pt did not attend group.     Drina Jobst, LRT/CTRS         Cathyann Kilfoyle A 01/04/2018 11:48 AM 

## 2018-01-04 NOTE — Progress Notes (Signed)
D:  Patient came to med window this morning in his wheelchair.  Patient denied HI.  Stated he continues to have SI thoughts to overdose on heroin after discharge.  Patient stated voices are at medium level this morning, voices tell him to hurt himself.  Denied seeing lights or shadows this morning. A:  Medications administered per MD orders.  Emotional support and encouragement given patient. R:  Safety maintained with 15 minute checks.  Patient returned to his bed after medications.

## 2018-02-08 ENCOUNTER — Ambulatory Visit (INDEPENDENT_AMBULATORY_CARE_PROVIDER_SITE_OTHER): Payer: Medicaid Other

## 2018-02-08 ENCOUNTER — Ambulatory Visit: Payer: Medicaid Other | Admitting: Podiatry

## 2018-02-08 DIAGNOSIS — M659 Synovitis and tenosynovitis, unspecified: Secondary | ICD-10-CM

## 2018-02-08 DIAGNOSIS — M19072 Primary osteoarthritis, left ankle and foot: Secondary | ICD-10-CM

## 2018-02-08 DIAGNOSIS — M66872 Spontaneous rupture of other tendons, left ankle and foot: Secondary | ICD-10-CM | POA: Diagnosis not present

## 2018-02-08 NOTE — Progress Notes (Signed)
   HPI: 52 year old male with a history of substance abuse, methamphetamine dependence, and cocaine use as well as opioid dependence presents today for evaluation regarding left foot and ankle pain.  Patient states that he is noticed significant amount of swelling and tenderness to the medial aspect of the left ankle the past few months.  Pain has slowly increased.  Patient denies trauma.  Periods of long standing aggravate his symptoms. 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  He states that he needs a prescription for prosthesis.  Past Medical History:  Diagnosis Date  . Hypertension      Physical Exam: General: The patient is alert and oriented x3 in no acute distress.  Dermatology: Skin is warm, dry and supple bilateral lower extremities. Negative for open lesions or macerations.  Vascular: Palpable pedal pulses bilaterally. No edema or erythema noted. Capillary refill within normal limits.  Neurological: Epicritic and protective threshold grossly intact bilaterally.   Musculoskeletal Exam: Range of motion within normal limits to all pedal and ankle joints bilateral. Muscle strength 5/5 in all groups bilateral.  Significant amount of pain on palpation along the posterior tibial tendon with swelling and also what appears to be a palpable mass along the posterior tibial tendon sheath.  Possibly consistent with a rupture.  Radiographic Exam:  Abnormal osseous erosion noted to the subtalar joint of the left foot with degenerative changes.  Possibly consistent with gouty arthritis  Assessment: 1.  Posterior tibial tendon rupture left 2.  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 Patient evaluated. X-Rays reviewed.  2.  Today we are going to order an MRI of the left ankle to rule out posterior tibial tendon rupture as well as to further evaluate the subtalar joint of the left foot 3.  preScription for meloxicam 15 mg daily 4.  Immobilization  cam boot was dispensed today weightbearing as tolerated 5.  Return to clinic in 4 weeks      Felecia ShellingBrent M. Nikoletta Varma, DPM [facility]  Dr. Felecia ShellingBrent M. Niraj Kudrna, DPM    [address]                                        [place], [place] [address]                Office [phone]  Fax [phone]

## 2018-02-10 ENCOUNTER — Other Ambulatory Visit: Payer: Medicaid Other | Admitting: Orthotics

## 2018-02-15 ENCOUNTER — Encounter (HOSPITAL_COMMUNITY): Payer: Self-pay | Admitting: Emergency Medicine

## 2018-02-15 ENCOUNTER — Emergency Department (HOSPITAL_COMMUNITY): Payer: Medicaid Other

## 2018-02-15 ENCOUNTER — Emergency Department (HOSPITAL_COMMUNITY)
Admission: EM | Admit: 2018-02-15 | Discharge: 2018-02-15 | Disposition: A | Discharge status: Home / Self Care | Payer: Medicaid Other | Attending: Emergency Medicine | Admitting: Emergency Medicine

## 2018-02-15 DIAGNOSIS — R45851 Suicidal ideations: Secondary | ICD-10-CM | POA: Diagnosis not present

## 2018-02-15 DIAGNOSIS — I10 Essential (primary) hypertension: Secondary | ICD-10-CM | POA: Insufficient documentation

## 2018-02-15 DIAGNOSIS — F17210 Nicotine dependence, cigarettes, uncomplicated: Secondary | ICD-10-CM | POA: Insufficient documentation

## 2018-02-15 DIAGNOSIS — M25572 Pain in left ankle and joints of left foot: Secondary | ICD-10-CM | POA: Diagnosis not present

## 2018-02-15 DIAGNOSIS — F1910 Other psychoactive substance abuse, uncomplicated: Secondary | ICD-10-CM

## 2018-02-15 DIAGNOSIS — F1920 Other psychoactive substance dependence, uncomplicated: Secondary | ICD-10-CM

## 2018-02-15 DIAGNOSIS — F1120 Opioid dependence, uncomplicated: Secondary | ICD-10-CM | POA: Diagnosis present

## 2018-02-15 LAB — COMPREHENSIVE METABOLIC PANEL
ALT: 153 U/L — AB (ref 0–44)
AST: 164 U/L — ABNORMAL HIGH (ref 15–41)
Albumin: 3.8 g/dL (ref 3.5–5.0)
Alkaline Phosphatase: 48 U/L (ref 38–126)
Anion gap: 7 (ref 5–15)
BUN: 11 mg/dL (ref 6–20)
CHLORIDE: 105 mmol/L (ref 98–111)
CO2: 24 mmol/L (ref 22–32)
Calcium: 8.9 mg/dL (ref 8.9–10.3)
Creatinine, Ser: 0.81 mg/dL (ref 0.61–1.24)
GLUCOSE: 107 mg/dL — AB (ref 70–99)
Potassium: 4.2 mmol/L (ref 3.5–5.1)
SODIUM: 136 mmol/L (ref 135–145)
TOTAL PROTEIN: 8.4 g/dL — AB (ref 6.5–8.1)
Total Bilirubin: 1.4 mg/dL — ABNORMAL HIGH (ref 0.3–1.2)

## 2018-02-15 LAB — RAPID URINE DRUG SCREEN, HOSP PERFORMED
AMPHETAMINES: NOT DETECTED
Barbiturates: NOT DETECTED
Benzodiazepines: NOT DETECTED
COCAINE: NOT DETECTED
Tetrahydrocannabinol: NOT DETECTED

## 2018-02-15 LAB — CBC
HCT: 43.7 % (ref 39.0–52.0)
Hemoglobin: 15.9 g/dL (ref 13.0–17.0)
MCH: 31.9 pg (ref 26.0–34.0)
MCHC: 36.4 g/dL — ABNORMAL HIGH (ref 30.0–36.0)
MCV: 87.8 fL (ref 78.0–100.0)
PLATELETS: 218 10*3/uL (ref 150–400)
RBC: 4.98 MIL/uL (ref 4.22–5.81)
RDW: 13.4 % (ref 11.5–15.5)
WBC: 4.9 10*3/uL (ref 4.0–10.5)

## 2018-02-15 LAB — ACETAMINOPHEN LEVEL

## 2018-02-15 LAB — SALICYLATE LEVEL: Salicylate Lvl: <7 mg/dL (ref 2.8–30.0)

## 2018-02-15 LAB — ETHANOL

## 2018-02-15 NOTE — ED Triage Notes (Signed)
Pt reports this morning he twisted his lef ankle and has pain and swelling.  Pt reports hearing voices again couple days ago.  Pt reports he has 50 year old son and his mother got got killed a year ago and that coming up and making him remember the pain making him want to go OD on heroin.

## 2018-02-15 NOTE — Progress Notes (Signed)
Patient decided he did not want detox despite meeting with Peer support who was pursuing placement.  Nanine MeansJamison Lord, PMHNP

## 2018-02-15 NOTE — ED Provider Notes (Signed)
[facility]-EMERGENCY DEPT Provider Note   CSN: [id number] Arrival date & time: 02/15/18  1137     History   Chief Complaint Chief Complaint  Patient presents with  . Ankle Pain  . Suicidal  . Hallucinations    HPI Dwayne Alvarez is a 52 y.o. male.  HPI  52 year old male comes in with chief complaint of suicidal thoughts and ankle pain. Patient has history of hypertension, polysubstance abuse and depression.  He reports that he has been having auditory hallucinations over the past few days along with suicidal thoughts.  He also twisted his ankle and complaining of left ankle pain.   Past Medical History:  Diagnosis Date  . Hypertension     Patient Active Problem List   Diagnosis Date Noted  . Substance induced mood disorder (HCC) 12/30/2017  . MDD (major depressive disorder), recurrent severe, without psychosis (HCC) 12/30/2017  . Suicidal ideation   . Methamphetamine dependence (HCC)   . Cocaine use disorder, mild, abuse (HCC)   . Polysubstance dependence including opioid type drug, episodic abuse [facility](HCC)     Past Surgical History:  Procedure Laterality Date  . FOOT AMPUTATION THROUGH METATARSAL Right         Home Medications    Prior to Admission medications   Not on File    Family History No family history on file.  Social History Social History   Tobacco Use  . Smoking status: Current Every Day Smoker    Types: Cigarettes  . Smokeless tobacco: Never Used  Substance Use Topics  . Alcohol use: Not Currently  . Drug use: Yes    Types: Methamphetamines, Cocaine    Comment: heroin     Allergies   Patient has no known allergies.   Review of Systems Review of Systems  Constitutional: Positive for activity change.  Respiratory: Negative for shortness of breath.   Cardiovascular: Negative for chest pain.  Allergic/Immunologic: Negative for immunocompromised state.  Hematological: Does not bruise/bleed easily.    Psychiatric/Behavioral: Positive for suicidal ideas.  All other systems reviewed and are negative.    Physical Exam Updated Vital Signs BP (!) 142/93 (BP Location: Left Arm)   Pulse 83   Temp 98 F (36.7 C) (Oral)   Resp 20   SpO2 99%   Physical Exam  Constitutional: He is oriented to person, place, and time. He appears well-developed.  HENT:  Head: Atraumatic.  Neck: Neck supple.  Cardiovascular: Normal rate.  Pulmonary/Chest: Effort normal.  Musculoskeletal: He exhibits edema and tenderness. He exhibits no deformity.  Left ankle is slightly edematous. No erythema or warmth to touch. Diffuse tenderness to palpation  Neurological: He is alert and oriented to person, place, and time.  Skin: Skin is warm.  Nursing note and vitals reviewed.    ED Treatments / Results  Labs (all labs ordered are listed, but only abnormal results are displayed) Labs Reviewed  COMPREHENSIVE METABOLIC PANEL - Abnormal; Notable for the following components:      Result Value   Glucose, Bld 107 (*)    Total Protein 8.4 (*)    AST 164 (*)    ALT 153 (*)    Total Bilirubin 1.4 (*)    All other components within normal limits  ACETAMINOPHEN LEVEL - Abnormal; Notable for the following components:   Acetaminophen (Tylenol), Serum <10 (*)    All other components within normal limits  CBC - Abnormal; Notable for the following components:   MCHC 36.4 (*)  All other components within normal limits  RAPID URINE DRUG SCREEN, HOSP PERFORMED - Abnormal; Notable for the following components:   Opiates   (*)    Value: Result not available. Reagent lot number recalled by manufacturer.   All other components within normal limits  ETHANOL  SALICYLATE LEVEL    EKG None  Radiology No results found.  Procedures Procedures (including critical care time)  Medications Ordered in ED Medications - No data to display   Initial Impression / Assessment and Plan / ED Course  I have reviewed the  triage vital signs and the nursing notes.  Pertinent labs & imaging results that were available during my care of the patient were reviewed by me and considered in my medical decision making (see chart for details).     52 year old male with history of polysubstance abuse, depression comes in with chief complaint of auditory hallucinations.  He is also been having suicidal thoughts.  TTS has been consulted for this reason.  Patient is also complaining of left ankle pain.  He reports that he had a mechanical fall that led to the swelling and pain.  Patient is limping.  X-rays ordered, they are negative for any fracture.  No clinical concerns for infection at this time, especially given the history of mechanical etiology prior to the symptoms starting.  Medically cleared for psych eval.  Final Clinical Impressions(s) / ED Diagnoses   Final diagnoses:  Suicidal ideation  Polysubstance abuse (HCC)  Polysubstance dependence including opioid type drug, episodic abuse [facility](HCC)    ED Discharge Orders         Ordered    Increase activity slowly     02/15/18 1444    Diet - low sodium heart healthy     02/15/18 1444    Discharge instructions    Comments:  Follow up with outpatient resources   02/15/18 1444           Derwood KaplanNanavati, Armandina Iman, MD 02/18/18 2322

## 2018-02-15 NOTE — ED Notes (Signed)
Pt oriented to room and unit.  Pt is calm and cooperative.  Pt denies AVH at this time.  Pt is very depressed and stated he wants help getting of of heroin.

## 2018-02-15 NOTE — Patient Outreach (Signed)
ED Peer Support Specialist Patient Intake (Complete at intake & 30-60 Day Follow-up)  Name: Dwayne Alvarez  MRN: [id number]  Age: 52 y.o.   Date of Admission: 02/15/2018  Intake: Initial Comments:      Primary Reason Admitted: depression and substance abuse   Lab values: Alcohol/ETOH: Positive Positive UDS? 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 Yes  Number of ED visits: 4  Reason(s) for visit: same situations   In past year, has patient been hospitalized for any reason? No  Number of hospitalizations:    Reason(s) for hospitalization:    In past year, has patient been arrested? No  Number of arrests:    Reason(s) for arrest:    In past year, has patient been incarcerated? No  Number of incarcerations:    Reason(s) for incarceration:    In past year, has patient received medication-assisted treatment? No  In past year, patient received the following treatments: [facility] )  In past year, has patient received any harm reduction services? No  Did this include any of the following?    In past year, has patient received care from a mental health provider for diagnosis other than SUD? No  In past year, is this first time patient has overdosed? No  Number of past overdoses:    In past year, is this first time patient has been hospitalized for an overdose? No  Number of hospitalizations for overdose(s):    Is patient currently receiving treatment for a mental health diagnosis?  No  Patient reports experiencing difficulty participating in SUD treatment: No    Most important reason(s) for this difficulty?    Has patient received prior services for treatment? No  In past, patient has received services from following agencies:    Plan of Care:  Suggested follow up at these agencies/treatment centers: (wants to get into a recovery based facility)  Other information: CPSS met with Pt and was able to gain information on what brought Pt in. CPSS was able to use Recovery based Service, CPSS processed with Pt to see what route Pt wants to look into receiving. CPSS gave Pt a few options that maybe good for Pt. CPSS was made aware what Pt was seeking and his plans for himself.CPSS was made aware after sending his information off that Pt was not wanting to go anywhere he wants to go home .    Dwayne Alvarez, CPSS  02/15/2018 2:08 PM

## 2018-02-15 NOTE — ED Notes (Signed)
Bed: [facility]4 Expected date:  Expected time:  Means of arrival:  Comments: 

## 2018-02-15 NOTE — ED Notes (Signed)
Pt discharged safely with discharge instructions.  All belongings were returned to pt.  Pt was in no distress at discharge and refused to wait for ankle to be wrapped.  Bus pass was given.

## 2018-02-28 ENCOUNTER — Inpatient Hospital Stay (HOSPITAL_COMMUNITY)
Admission: AD | Admit: 2018-02-28 | Discharge: 2018-03-04 | DRG: 885 | Disposition: A | Discharge status: Other Acute Inpatient Hospital | Payer: Federal, State, Local not specified - Other | Source: Intra-hospital | Attending: Psychiatry | Admitting: Psychiatry

## 2018-02-28 ENCOUNTER — Encounter (HOSPITAL_COMMUNITY): Payer: Self-pay

## 2018-02-28 ENCOUNTER — Other Ambulatory Visit: Payer: Self-pay

## 2018-02-28 DIAGNOSIS — F17210 Nicotine dependence, cigarettes, uncomplicated: Secondary | ICD-10-CM | POA: Diagnosis present

## 2018-02-28 DIAGNOSIS — F333 Major depressive disorder, recurrent, severe with psychotic symptoms: Principal | ICD-10-CM | POA: Diagnosis present

## 2018-02-28 DIAGNOSIS — Z89431 Acquired absence of right foot: Secondary | ICD-10-CM | POA: Diagnosis not present

## 2018-02-28 DIAGNOSIS — F1520 Other stimulant dependence, uncomplicated: Secondary | ICD-10-CM | POA: Diagnosis present

## 2018-02-28 DIAGNOSIS — G4700 Insomnia, unspecified: Secondary | ICD-10-CM | POA: Diagnosis present

## 2018-02-28 DIAGNOSIS — B1920 Unspecified viral hepatitis C without hepatic coma: Secondary | ICD-10-CM | POA: Diagnosis present

## 2018-02-28 DIAGNOSIS — Z79899 Other long term (current) drug therapy: Secondary | ICD-10-CM

## 2018-02-28 DIAGNOSIS — Z91411 Personal history of adult psychological abuse: Secondary | ICD-10-CM | POA: Diagnosis not present

## 2018-02-28 DIAGNOSIS — I10 Essential (primary) hypertension: Secondary | ICD-10-CM | POA: Diagnosis present

## 2018-02-28 DIAGNOSIS — R45851 Suicidal ideations: Secondary | ICD-10-CM | POA: Diagnosis present

## 2018-02-28 DIAGNOSIS — F1120 Opioid dependence, uncomplicated: Secondary | ICD-10-CM | POA: Diagnosis present

## 2018-02-28 DIAGNOSIS — F410 Panic disorder [episodic paroxysmal anxiety] without agoraphobia: Secondary | ICD-10-CM | POA: Diagnosis present

## 2018-02-28 DIAGNOSIS — Z62810 Personal history of physical and sexual abuse in childhood: Secondary | ICD-10-CM | POA: Diagnosis present

## 2018-02-28 DIAGNOSIS — Z91410 Personal history of adult physical and sexual abuse: Secondary | ICD-10-CM | POA: Diagnosis not present

## 2018-02-28 LAB — CBC WITH DIFFERENTIAL/PLATELET
BASOS ABS: 0 10*3/uL (ref 0.0–0.1)
BASOS PCT: 1 %
EOS PCT: 8 %
Eosinophils Absolute: 0.5 10*3/uL (ref 0.0–0.7)
HCT: 37.5 % — ABNORMAL LOW (ref 39.0–52.0)
Hemoglobin: 13.5 g/dL (ref 13.0–17.0)
Lymphocytes Relative: 39 %
Lymphs Abs: 2.3 10*3/uL (ref 0.7–4.0)
MCH: 31.6 pg (ref 26.0–34.0)
MCHC: 36 g/dL (ref 30.0–36.0)
MCV: 87.8 fL (ref 78.0–100.0)
MONO ABS: 0.7 10*3/uL (ref 0.1–1.0)
Monocytes Relative: 12 %
Neutro Abs: 2.3 10*3/uL (ref 1.7–7.7)
Neutrophils Relative %: 40 %
PLATELETS: 241 10*3/uL (ref 150–400)
RBC: 4.27 MIL/uL (ref 4.22–5.81)
RDW: 13.1 % (ref 11.5–15.5)
WBC: 5.8 10*3/uL (ref 4.0–10.5)

## 2018-02-28 LAB — COMPREHENSIVE METABOLIC PANEL
ALT: 119 U/L — AB (ref 0–44)
AST: 185 U/L — AB (ref 15–41)
Albumin: 3.3 g/dL — ABNORMAL LOW (ref 3.5–5.0)
Alkaline Phosphatase: 44 U/L (ref 38–126)
Anion gap: 6 (ref 5–15)
BILIRUBIN TOTAL: 0.8 mg/dL (ref 0.3–1.2)
BUN: 20 mg/dL (ref 6–20)
CALCIUM: 8.3 mg/dL — AB (ref 8.9–10.3)
CO2: 28 mmol/L (ref 22–32)
CREATININE: 1.17 mg/dL (ref 0.61–1.24)
Chloride: 99 mmol/L (ref 98–111)
GFR calc Af Amer: >60 mL/min (ref >60)
Glucose, Bld: 113 mg/dL — ABNORMAL HIGH (ref 70–99)
Potassium: 3.5 mmol/L (ref 3.5–5.1)
Sodium: 133 mmol/L — ABNORMAL LOW (ref 135–145)
TOTAL PROTEIN: 6.9 g/dL (ref 6.5–8.1)

## 2018-02-28 LAB — AMMONIA: AMMONIA: 42 umol/L — AB (ref 9–35)

## 2018-02-28 MED ORDER — HYDROXYZINE HCL 25 MG PO TABS
25.0000 mg | ORAL_TABLET | Freq: Four times a day (QID) | ORAL | Status: DC | PRN
  Administered 2018-02-28 – 2018-03-03 (×3): 25 mg via ORAL
  Filled 2018-02-28 (×2): qty 1
  Filled 2018-02-28: qty 10
  Filled 2018-02-28: qty 1

## 2018-02-28 MED ORDER — DICYCLOMINE HCL 20 MG PO TABS
20.0000 mg | ORAL_TABLET | Freq: Four times a day (QID) | ORAL | Status: DC | PRN
  Administered 2018-03-01 – 2018-03-02 (×2): 20 mg via ORAL
  Filled 2018-02-28 (×2): qty 1

## 2018-02-28 MED ORDER — NAPROXEN 500 MG PO TABS
500.0000 mg | ORAL_TABLET | Freq: Two times a day (BID) | ORAL | Status: DC | PRN
  Administered 2018-02-28 – 2018-03-02 (×3): 500 mg via ORAL
  Filled 2018-02-28 (×3): qty 1

## 2018-02-28 MED ORDER — MAGNESIUM HYDROXIDE 400 MG/5ML PO SUSP: 30.0000 mL | Freq: Every day | ORAL | Status: DC | PRN

## 2018-02-28 MED ORDER — CLONIDINE HCL 0.1 MG PO TABS
0.1000 mg | ORAL_TABLET | Freq: Every day | ORAL | Status: DC
  Administered 2018-03-04: 0.1 mg via ORAL
  Filled 2018-02-28 (×2): qty 1
  Filled 2018-02-28: qty 21

## 2018-02-28 MED ORDER — ALUM & MAG HYDROXIDE-SIMETH 200-200-20 MG/5ML PO SUSP: 30.0000 mL | ORAL | Status: DC | PRN

## 2018-02-28 MED ORDER — METHOCARBAMOL 500 MG PO TABS
500.0000 mg | ORAL_TABLET | Freq: Three times a day (TID) | ORAL | Status: DC | PRN
  Administered 2018-02-28 – 2018-03-03 (×3): 500 mg via ORAL
  Filled 2018-02-28 (×3): qty 1

## 2018-02-28 MED ORDER — NICOTINE 14 MG/24HR TD PT24
14.0000 mg | MEDICATED_PATCH | Freq: Every day | TRANSDERMAL | Status: DC
  Administered 2018-02-28 – 2018-03-04 (×4): 14 mg via TRANSDERMAL
  Filled 2018-02-28 (×8): qty 1

## 2018-02-28 MED ORDER — CLONIDINE HCL 0.1 MG PO TABS
0.1000 mg | ORAL_TABLET | ORAL | Status: AC
  Administered 2018-03-02 – 2018-03-03 (×4): 0.1 mg via ORAL
  Filled 2018-02-28 (×4): qty 1

## 2018-02-28 MED ORDER — ONDANSETRON 4 MG PO TBDP: 4.0000 mg | ORAL_TABLET | Freq: Four times a day (QID) | ORAL | Status: DC | PRN

## 2018-02-28 MED ORDER — LOPERAMIDE HCL 2 MG PO CAPS: 2.0000 mg | ORAL_CAPSULE | ORAL | Status: DC | PRN

## 2018-02-28 MED ORDER — CLONIDINE HCL 0.1 MG PO TABS
0.1000 mg | ORAL_TABLET | Freq: Four times a day (QID) | ORAL | Status: AC
  Administered 2018-02-28 – 2018-03-01 (×6): 0.1 mg via ORAL
  Filled 2018-02-28 (×12): qty 1

## 2018-02-28 MED ORDER — TRAZODONE HCL 50 MG PO TABS
50.0000 mg | ORAL_TABLET | Freq: Every evening | ORAL | Status: DC | PRN
  Administered 2018-02-28 – 2018-03-03 (×4): 50 mg via ORAL
  Filled 2018-02-28: qty 1
  Filled 2018-02-28: qty 21
  Filled 2018-02-28 (×3): qty 1

## 2018-02-28 MED ORDER — ACETAMINOPHEN 325 MG PO TABS: 650.0000 mg | ORAL_TABLET | Freq: Four times a day (QID) | ORAL | Status: DC | PRN

## 2018-02-28 NOTE — Plan of Care (Signed)
  Problem: Safety: Goal: Periods of time without injury will increase Outcome: Progressing   Problem: Activity: Goal: Interest or engagement in activities will improve Outcome: Progressing   Problem: Coping: Goal: Ability to verbalize frustrations and anger appropriately will improve Outcome: Progressing

## 2018-02-28 NOTE — Plan of Care (Signed)
Dwayne Alvarez has been sleeping. He is cooperative and currently denies any physical complaints. He is on Clonidine protocol and tolerating it well. No reports of any withdrawal. Dwayne Alvarez is currently denying S.I. Has been sleeping a lot. Did not attend group. Goal will be to increase participation on unit as he feels better physically.

## 2018-02-28 NOTE — Progress Notes (Signed)
Psychoeducational Group Note  Date:  02/28/2018 Time:  2325  Group Topic/Focus:  Wrap-Up Group:   The focus of this group is to help patients review their daily goal of treatment and discuss progress on daily workbooks.  Participation Level: Did Not Attend  Participation Quality:  Not Applicable  Affect:  Not Applicable  Cognitive:  Not Applicable  Insight:  Not Applicable  Engagement in Group: Not Applicable  Additional Comments:  The patient did not attend group since he was asleep in his bedroom.   Hazle Coca S 02/28/2018, 11:25 PM

## 2018-02-28 NOTE — Progress Notes (Signed)
Pt is moved from 505-1 to 304 - 2.
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
## 2018-02-28 NOTE — Progress Notes (Signed)
Patient ID: Dwayne Alvarez, male   DOB: 27-Mar-1966, 52 y.o.   MRN: [id number]  Admission Note:  D: 52 yr male who presents from [facility], in a wheel chair, VC in no acute distress for the treatment of SI , SA and Depression. Pt appears flat and depressed. Pt was calm and cooperative with admission process. Pt presents with passive SI and contracts for safety upon admission. Pt denies AVH / HI . Pt stated he was evicted from his [place] house but plans to return after he gets Tx. Pt stated he would like to go to LT SA Tx facility after leaving here. Pt very restless during assessment , pt stated he was doing Meth and IV Heroin.   A:Skin was assessed with (Linda-RN) and found to be clear of any abnormal marks apart from multiple tattoos, abrasion on forehead, Rash L-foot. PT searched and no contraband found, POC and unit policies explained and understanding verbalized. Consents obtained. Food and fluids offered, and  Accepted.  R: Pt had no additional questions or concerns.

## 2018-02-28 NOTE — BHH Suicide Risk Assessment (Signed)
[facility] Admission Suicide Risk Assessment   Nursing information obtained from:  Patient Demographic factors:  Male, Caucasian, Low socioeconomic status, Access to firearms Current Mental Status:  Suicidal ideation indicated by patient, Self-harm thoughts Loss Factors:  Loss of significant relationship Historical Factors:  Prior suicide attempts, Family history of mental illness or substance abuse Risk Reduction Factors:  Positive coping skills or problem solving skills  Total Time spent with patient: 20 minutes Principal Problem: <principal problem not specified> Diagnosis:   Patient Active Problem List   Diagnosis Date Noted  . Severe recurrent major depression w/psychotic features, mood-congruent (HCC) [F33.3] 02/28/2018  . Substance induced mood disorder (HCC) [F19.94] 12/30/2017  . MDD (major depressive disorder), recurrent severe, without psychosis (HCC) [F33.2] 12/30/2017  . Suicidal ideation [R45.851]   . 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 Cocaine use disorder, mild, abuse (HCC) [F14.10]   . Polysubstance dependence including opioid type drug, episodic abuse (HCC) [F11.20, F19.20]    Subjective Data: Patient is seen and examined.  Patient is a 52 year old male with a past psychiatric history significant for opiate dependence, methamphetamine dependence and cocaine dependence who presented to the [facility] emergency department on 02/27/2018 with suicidal ideation.  The patient said he had been approached by [employer] police when he was walking down the road.  He walks very unsteadily, so the police spoke to him.  He told police he needed to be seen by mental health and that he wanted to kill himself.  He told police he was going to overdose on heroin.  He stated he had been using IV heroin at least 1 g/day 2-3 times a day.  The patient had recently been hospitalized at the behavioral health hospital on 12/30/2017.  At that time patient had been living in a halfway house in  [place], and that he wanted to get sober, but there were drugs in the facility.  He left there and came to the hospital.  He is been in multiple halfway houses over the last several months.  At the discharge from the hospital in July [facility]  He stated that he had remained sober for 2 to 3 weeks, but then started using again.  He stated after he [facility] he went to an [place] house, but then left there after he relapsed on heroin.  He stated he left there 2 to 3 days ago.  It is unclear how he got to [facility] from the [place] area.  He was transferred to our facility for evaluation and stabilization.  Continued Clinical Symptoms:  Alcohol Use Disorder Identification Test Final Score (AUDIT): 0 The "Alcohol Use Disorders Identification Test", Guidelines for Use in Primary Care, Second Edition.  World Science writer [facility]). Score between 0-7:  no or low risk or alcohol related problems. Score between 8-15:  moderate risk of alcohol related problems. Score between 16-19:  high risk of alcohol related problems. Score 20 or above:  warrants further diagnostic evaluation for alcohol dependence and treatment.   CLINICAL FACTORS:   Alcohol/Substance Abuse/Dependencies   Musculoskeletal: Strength & Muscle Tone: within normal limits Gait & Station: unable to stand Patient leans: N/A  Psychiatric Specialty Exam: Physical Exam  Nursing note and vitals reviewed. Constitutional: He is oriented to person, place, and time. He appears well-developed and well-nourished.  HENT:  Head: Normocephalic and atraumatic.  Respiratory: Effort normal.  Neurological: He is alert and oriented to person, place, and time.  ROS  Blood pressure (!) 122/99, pulse 93, temperature 98.6 F (37 C), temperature source Oral, resp. rate 20, height 6' (1.829 m), weight 85.3 kg.Body mass index is 25.5 kg/m.  General Appearance: Disheveled  Eye Contact:   Minimal  Speech:  Pressured  Volume:  Normal  Mood:  Anxious, Dysphoric and Irritable  Affect:  Congruent  Thought Process:  Coherent and Descriptions of Associations: Intact  Orientation:  Negative  Thought Content:  Tangential  Suicidal Thoughts:  Yes.  without intent/plan  Homicidal Thoughts:  No  Memory:  Immediate;   Poor Recent;   Poor Remote;   Poor  Judgement:  Impaired  Insight:  Lacking  Psychomotor Activity:  Increased and Restlessness  Concentration:  Concentration: Poor and Attention Span: Poor  Recall:  Poor  Fund of Knowledge:  Poor  Language:  Fair  Akathisia:  Negative  Handed:  Right  AIMS (if indicated):     Assets:  Desire for Improvement Resilience  ADL's:  Impaired  Cognition:  Impaired,  Mild  Sleep:  Number of Hours: 0.75      COGNITIVE FEATURES THAT CONTRIBUTE TO RISK:  None    SUICIDE RISK:   Mild:  Suicidal ideation of limited frequency, intensity, duration, and specificity.  There are no identifiable plans, no associated intent, mild dysphoria and related symptoms, good self-control (both objective and subjective assessment), few other risk factors, and identifiable protective factors, including available and accessible social support.  PLAN OF CARE: Patient is seen and examined.  Patient is a 52 year old male with a past psychiatric history for polysubstance dependence.  His drug screen at [facility] were positive for opiates as well as amphetamines.  He was psychotic on admission and having hallucinations most likely secondary to the substances.  He is still physically agitated and withdrawing at this time.  He will be admitted to the hospital.  He will be placed on the opiate withdrawal protocol.  He will be integrated into the milieu.  He will be encouraged to attend groups when he is physically and emotionally able.  His white blood cell count was mildly elevated at 11.9.  His monocytes were elevated at 15.2.  His liver function enzymes were  significantly elevated with an AST of 236, and an ALT of 162.  On 8/19 his AST was 164 and his ALT was 153.  He does have a history of hepatitis C.  They did not check an ammonia in the emergency department, but we will.  We will see if this is having any effect on his current mental status.  Hopefully as the drugs and withdrawal syndromes resolved he will improve.  I certify that inpatient services furnished can reasonably be expected to improve the patient's condition.   Antonieta Pert, MD 02/28/2018, 9:23 AM
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
## 2018-02-28 NOTE — Progress Notes (Signed)
DAR NOTE: Patient presents with anxious affect and depressed mood.  Pt has been isolative, stays in bed, stayed back during lunch, has not been interacting much. Pt stated he did not sleep well last night, appetite is good, and low energy. Denies pain, SI/HI, auditory and visual hallucinations.  Rates depression at 7, hopelessness at 7, and anxiety at 7.  Maintained on routine safety checks.  Medications given as prescribed.  Support and encouragement offered as needed. Will continue to monitor.

## 2018-02-28 NOTE — BHH Group Notes (Signed)
BHH LCSW Group Therapy Note  Date/Time:  02/28/2018  11:00AM-12:00PM  Type of Therapy and Topic:  Group Therapy:  Music and Mood  Participation Level:  Did Not Attend   Description of Group: In this process group, members listened to a variety of genres of music and identified that different types of music evoke different responses.  Patients were encouraged to identify music that was soothing for them and music that was energizing for them.  Patients discussed how this knowledge can help with wellness and recovery in various ways including managing depression and anxiety as well as encouraging healthy sleep habits.    Therapeutic Goals: 1. Patients will explore the impact of different varieties of music on mood 2. Patients will verbalize the thoughts they have when listening to different types of music 3. Patients will identify music that is soothing to them as well as music that is energizing to them 4. Patients will discuss how to use this knowledge to assist in maintaining wellness and recovery 5. Patients will explore the use of music as a coping skill  Summary of Patient Progress:  N/A  Therapeutic Modalities: Solution Focused Brief Therapy Activity   Ambrose Mantle, LCSW

## 2018-02-28 NOTE — Tx Team (Signed)
Initial Treatment Plan 02/28/2018 5:24 AM [name] MBW:[id number]    PATIENT STRESSORS: Health problems Legal issue Medication change or noncompliance Substance abuse   PATIENT STRENGTHS: Capable of independent living General fund of knowledge Motivation for treatment/growth   PATIENT IDENTIFIED PROBLEMS: Risk for suicide  Substance Abuse  "want to go to long term Treatment facility"                 DISCHARGE CRITERIA:  Adequate post-discharge living arrangements Motivation to continue treatment in a less acute level of care Verbal commitment to aftercare and medication compliance Withdrawal symptoms are absent or subacute and managed without 24-hour nursing intervention  PRELIMINARY DISCHARGE PLAN: Attend aftercare/continuing care group Outpatient therapy Placement in alternative living arrangements  PATIENT/FAMILY INVOLVEMENT: This treatment plan has been presented to and reviewed with the patient, Dwayne Alvarez.  The patient and family have been given the opportunity to ask questions and make suggestions.  [name], RN 02/28/2018, 5:24 AM

## 2018-03-01 NOTE — BHH Suicide Risk Assessment (Signed)
BHH INPATIENT:  Family/Significant Other Suicide Prevention Education  Suicide Prevention Education:  Patient Refusal for Family/Significant Other Suicide Prevention Education: The patient Dwayne Alvarez has refused to provide written consent for family/significant other to be provided Family/Significant Other Suicide Prevention Education during admission and/or prior to discharge.  Physician notified.  SPE completed with pt, as pt refused to consent to family contact. SPI pamphlet provided to pt and pt was encouraged to share information with support network, ask questions, and talk about any concerns relating to SPE. Pt denies access to guns/firearms and verbalized understanding of information provided. Mobile Crisis information also provided to pt.   Rona Ravens LCSW 03/01/2018, 12:13 PM

## 2018-03-01 NOTE — Tx Team (Signed)
Interdisciplinary Treatment and Diagnostic Plan Update  03/01/2018 Time of Session: 0830AM Dwayne Alvarez MRN: [id number]  Principal Diagnosis: Severe recurrent major depression w/psychotic features, mood-congruent [facility])  Secondary Diagnoses: Principal Problem:   Severe recurrent major depression w/psychotic features, mood-congruent ([place])   Current Medications:  Current Facility-Administered Medications  Medication Dose Route Frequency Provider Last Rate Last Dose  . acetaminophen (TYLENOL) tablet 650 mg  650 mg Oral Q6H PRN Lindon Romp A, NP      . alum & mag hydroxide-simeth (MAALOX/MYLANTA) 200-200-20 MG/5ML suspension 30 mL  30 mL Oral Q4H PRN Lindon Romp A, NP      . cloNIDine (CATAPRES) tablet 0.1 mg  0.1 mg Oral QID Lindon Romp A, NP   0.1 mg at 02/28/18 2129   Followed by  . [START ON 03/02/2018] cloNIDine (CATAPRES) tablet 0.1 mg  0.1 mg Oral BH-qamhs Rozetta Nunnery, NP       Followed by  . [START ON 03/04/2018] cloNIDine (CATAPRES) tablet 0.1 mg  0.1 mg Oral QAC breakfast Lindon Romp A, NP      . dicyclomine (BENTYL) tablet 20 mg  20 mg Oral Q6H PRN Lindon Romp A, NP      . hydrOXYzine (ATARAX/VISTARIL) tablet 25 mg  25 mg Oral Q6H PRN Lindon Romp A, NP   25 mg at 02/28/18 0355  . loperamide (IMODIUM) capsule 2-4 mg  2-4 mg Oral PRN Lindon Romp A, NP      . magnesium hydroxide (MILK OF MAGNESIA) suspension 30 mL  30 mL Oral Daily PRN Lindon Romp A, NP      . methocarbamol (ROBAXIN) tablet 500 mg  500 mg Oral Q8H PRN Lindon Romp A, NP   500 mg at 02/28/18 0355  . naproxen (NAPROSYN) tablet 500 mg  500 mg Oral BID PRN Lindon Romp A, NP   500 mg at 02/28/18 0355  . nicotine (NICODERM CQ - dosed in mg/24 hours) patch 14 mg  14 mg Transdermal Daily Lindon Romp A, NP   14 mg at 02/28/18 0754  . ondansetron (ZOFRAN-ODT) disintegrating tablet 4 mg  4 mg Oral Q6H PRN Lindon Romp A, NP      . traZODone (DESYREL) tablet 50 mg  50 mg Oral QHS PRN Rozetta Nunnery, NP   50 mg at 02/28/18  2127   PTA Medications: No medications prior to admission.    Patient Stressors: Health problems Legal issue Medication change or noncompliance Substance abuse  Patient Strengths: Capable of independent living General fund of knowledge Motivation for treatment/growth  Treatment Modalities: Medication Management, Group therapy, Case management,  1 to 1 session with clinician, Psychoeducation, Recreational therapy.   Physician Treatment Plan for Primary Diagnosis: Severe recurrent major depression w/psychotic features, mood-congruent ([place]) Long Term Goal(s): Improvement in symptoms so as ready for discharge Improvement in symptoms so as ready for discharge   Short Term Goals: Ability to identify changes in lifestyle to reduce recurrence of condition will improve Ability to verbalize feelings will improve Ability to disclose and discuss suicidal ideas Ability to demonstrate self-control will improve Ability to identify and develop effective coping behaviors will improve Ability to maintain clinical measurements within normal limits will improve Ability to identify triggers associated with substance abuse/mental health issues will improve Ability to identify changes in lifestyle to reduce recurrence of condition will improve Ability to verbalize feelings will improve Ability to disclose and discuss suicidal ideas Ability to demonstrate self-control will improve Ability to identify and develop effective coping behaviors will  improve Ability to maintain clinical measurements within normal limits will improve  Medication Management: Evaluate patient's response, side effects, and tolerance of medication regimen.  Therapeutic Interventions: 1 to 1 sessions, Unit Group sessions and Medication administration.  Evaluation of Outcomes: Not Met  Physician Treatment Plan for Secondary Diagnosis: Principal Problem:   Severe recurrent major depression w/psychotic features, mood-congruent  ([place])  Long Term Goal(s): Improvement in symptoms so as ready for discharge Improvement in symptoms so as ready for discharge   Short Term Goals: Ability to identify changes in lifestyle to reduce recurrence of condition will improve Ability to verbalize feelings will improve Ability to disclose and discuss suicidal ideas Ability to demonstrate self-control will improve Ability to identify and develop effective coping behaviors will improve Ability to maintain clinical measurements within normal limits will improve Ability to identify triggers associated with substance abuse/mental health issues will improve Ability to identify changes in lifestyle to reduce recurrence of condition will improve Ability to verbalize feelings will improve Ability to disclose and discuss suicidal ideas Ability to demonstrate self-control will improve Ability to identify and develop effective coping behaviors will improve Ability to maintain clinical measurements within normal limits will improve     Medication Management: Evaluate patient's response, side effects, and tolerance of medication regimen.  Therapeutic Interventions: 1 to 1 sessions, Unit Group sessions and Medication administration.  Evaluation of Outcomes: Not Met   RN Treatment Plan for Primary Diagnosis: Severe recurrent major depression w/psychotic features, mood-congruent ([place]) Long Term Goal(s): Knowledge of disease and therapeutic regimen to maintain health will improve  Short Term Goals: Ability to remain free from injury will improve, Ability to demonstrate self-control, Ability to verbalize feelings will improve and Ability to identify and develop effective coping behaviors will improve  Medication Management: RN will administer medications as ordered by provider, will assess and evaluate patient's response and provide education to patient for prescribed medication. RN will report any adverse and/or side effects to prescribing  provider.  Therapeutic Interventions: 1 on 1 counseling sessions, Psychoeducation, Medication administration, Evaluate responses to treatment, Monitor vital signs and CBGs as ordered, Perform/monitor CIWA, COWS, AIMS and Fall Risk screenings as ordered, Perform wound care treatments as ordered.  Evaluation of Outcomes: Not Met   LCSW Treatment Plan for Primary Diagnosis: Severe recurrent major depression w/psychotic features, mood-congruent ([place]) Long Term Goal(s): Safe transition to appropriate next level of care at discharge, Engage patient in therapeutic group addressing interpersonal concerns.  Short Term Goals: Engage patient in aftercare planning with referrals and resources, Facilitate patient progression through stages of change regarding substance use diagnoses and concerns and Identify triggers associated with mental health/substance abuse issues  Therapeutic Interventions: Assess for all discharge needs, 1 to 1 time with Social worker, Explore available resources and support systems, Assess for adequacy in community support network, Educate family and significant other(s) on suicide prevention, Complete Psychosocial Assessment, Interpersonal group therapy.  Evaluation of Outcomes: Not Met   Progress in Treatment: Attending groups: No. New to unit. Continuing to assess.  Participating in groups: No. Taking medication as prescribed: Yes. Toleration medication: Yes. Family/Significant other contact made: No, will contact:  family member if pt consents to collateral contact. Patient understands diagnosis: Yes. Discussing patient identified problems/goals with staff: Yes. Medical problems stabilized or resolved: Yes. Denies suicidal/homicidal ideation: Yes. Issues/concerns per patient self-inventory: No. Other: n/a   New problem(s) identified: No, Describe:  n/a  New Short Term/Long Term Goal(s): .tgoalsd  Patient Goals:  "I want to get into a  long term treatment facility."    Discharge Plan or Barriers: CSW assessing for appropriate referrals. Green pamphlet, Mobile Crisis information, and AA/NA information provided to patient for additional community support and resources.   Reason for Continuation of Hospitalization: Anxiety Depression Medication stabilization Suicidal ideation Withdrawal symptoms  Estimated Length of Stay: Wed, 03/03/18  Attendees: Patient: 03/01/2018 8:41 AM  Physician: Dr. Mallie Darting MD; Dr. Nancy Fetter MD 03/01/2018 8:41 AM  Nursing: Nicoletta Dress RN; Estill Bamberg RN 03/01/2018 8:41 AM  RN Care Manager:x 03/01/2018 8:41 AM  Social Worker: Janice Norrie LCSW 03/01/2018 8:41 AM  Recreational Therapist: x 03/01/2018 8:41 AM  Other: Ricky Ala NP; Mordecai Maes NP 03/01/2018 8:41 AM  Other:  03/01/2018 8:41 AM  Other: 03/01/2018 8:41 AM    Scribe for Treatment Team: Avelina Laine, LCSW 03/01/2018 8:41 AM

## 2018-03-01 NOTE — Progress Notes (Signed)
Sleeping without problems noted. 

## 2018-03-01 NOTE — Progress Notes (Addendum)
[facility] MD Progress Note  03/01/2018 11:40 AM Dwayne Alvarez  MRN:  [id number] Subjective: Patient is seen and examined.  Patient is a 52 year old male with a past psychiatric history significant for polysubstance dependence, substance-induced mood disorder and substance withdrawal syndrome.Marland Kitchen  He is seen in follow-up.  He is essentially unchanged today.  He remains physically agitated, tossing and turning.  He stated he felt a little better today.  His vital signs are stable this morning.  His blood pressure is 102/74, pulse is 68.  He slept 5.75 hours.  He denied any suicidal ideation this morning.  He remains on the opiate withdrawal protocol.  He is receiving Naprosyn for pain. Principal Problem: Severe recurrent major depression w/psychotic features, mood-congruent ([place]) Diagnosis:   Patient Active Problem List   Diagnosis Date Noted  . Severe recurrent major depression w/psychotic features, mood-congruent ([place]) [F33.3] 02/28/2018  . Substance induced mood disorder ([place]) [F19.94] 12/30/2017  . MDD (major depressive disorder), recurrent severe, without psychosis ([place]) [F33.2] 12/30/2017  . Suicidal ideation [R45.851]   . 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 Cocaine use disorder, mild, abuse ([place]) [F14.10]   . Polysubstance dependence including opioid type drug, episodic abuse ([place]) [F11.20, F19.20]    Total Time spent with patient: 15 minutes  Past Psychiatric History: See admission H&P  Past Medical History:  Past Medical History:  Diagnosis Date  . Hypertension     Past Surgical History:  Procedure Laterality Date  . FOOT AMPUTATION THROUGH METATARSAL Right    Family History: History reviewed. No pertinent family history. Family Psychiatric  History: See admission H&P Social History:  Social History   Substance and Sexual Activity  Alcohol Use Not Currently     Social History   Substance and Sexual Activity  Drug Use Yes  . Types: Methamphetamines, Cocaine, Oxycodone    Comment: heroin    Social History   Socioeconomic History  . Marital status: Single    Spouse name: Not on file  . Number of children: Not on file  . Years of education: Not on file  . Highest education level: Not on file  Occupational History  . Not on file  Social Needs  . Financial resource strain: Not on file  . Food insecurity:    Worry: Not on file    Inability: Not on file  . Transportation needs:    Medical: Not on file    Non-medical: Not on file  Tobacco Use  . Smoking status: Current Every Day Smoker    Packs/day: 0.50    Years: 30.00    Pack years: 15.00    Types: Cigarettes  . Smokeless tobacco: Never Used  Substance and Sexual Activity  . Alcohol use: Not Currently  . Drug use: Yes    Types: Methamphetamines, Cocaine, Oxycodone    Comment: heroin  . Sexual activity: Not Currently  Lifestyle  . Physical activity:    Days per week: Not on file    Minutes per session: Not on file  . Stress: Not on file  Relationships  . Social connections:    Talks on phone: Not on file    Gets together: Not on file    Attends religious service: Not on file    Active member of club or organization: Not on file    Attends meetings of clubs or organizations: Not on file    Relationship status: Not on file  Other Topics Concern  . Not on file  Social History Narrative  .  Not on file   Additional Social History:    Pain Medications: Abusing heroin Prescriptions: None Over the Counter: None History of alcohol / drug use?: Yes Withdrawal Symptoms: Sweats, Tremors, Cramps, Tingling, Patient aware of relationship between substance abuse and physical/medical complications Name of Substance 1: Heroin IV 1 - Age of First Use: unknown 1 - Amount (size/oz): 1 gram per day 1 - Frequency: Shooting up 2-3 times in a day 1 - Duration: on-going 1 - Last Use / Amount: 02/27/18 around 01:00 Name of Substance 2: Methamphetamine IV (will use when he can't get heroin) 2 - Age  of First Use: Unknown 2 - Amount (size/oz): Will use 1/2 gram 2 - Frequency: Shooting up 2-3 times in a day 2 - Duration: on-going  2 - Last Use / Amount: 02/26/18                Sleep: Fair  Appetite:  Fair  Current Medications: Current Facility-Administered Medications  Medication Dose Route Frequency Provider Last Rate Last Dose  . acetaminophen (TYLENOL) tablet 650 mg  650 mg Oral Q6H PRN Lindon Romp A, NP      . alum & mag hydroxide-simeth (MAALOX/MYLANTA) 200-200-20 MG/5ML suspension 30 mL  30 mL Oral Q4H PRN Lindon Romp A, NP      . cloNIDine (CATAPRES) tablet 0.1 mg  0.1 mg Oral QID Lindon Romp A, NP   0.1 mg at 02/28/18 2129   Followed by  . [START ON 03/02/2018] cloNIDine (CATAPRES) tablet 0.1 mg  0.1 mg Oral BH-qamhs Rozetta Nunnery, NP       Followed by  . [START ON 03/04/2018] cloNIDine (CATAPRES) tablet 0.1 mg  0.1 mg Oral QAC breakfast Lindon Romp A, NP      . dicyclomine (BENTYL) tablet 20 mg  20 mg Oral Q6H PRN Lindon Romp A, NP      . hydrOXYzine (ATARAX/VISTARIL) tablet 25 mg  25 mg Oral Q6H PRN Lindon Romp A, NP   25 mg at 02/28/18 0355  . loperamide (IMODIUM) capsule 2-4 mg  2-4 mg Oral PRN Lindon Romp A, NP      . magnesium hydroxide (MILK OF MAGNESIA) suspension 30 mL  30 mL Oral Daily PRN Lindon Romp A, NP      . methocarbamol (ROBAXIN) tablet 500 mg  500 mg Oral Q8H PRN Lindon Romp A, NP   500 mg at 02/28/18 0355  . naproxen (NAPROSYN) tablet 500 mg  500 mg Oral BID PRN Lindon Romp A, NP   500 mg at 02/28/18 0355  . nicotine (NICODERM CQ - dosed in mg/24 hours) patch 14 mg  14 mg Transdermal Daily Lindon Romp A, NP   14 mg at 02/28/18 0754  . ondansetron (ZOFRAN-ODT) disintegrating tablet 4 mg  4 mg Oral Q6H PRN Rozetta Nunnery, NP      . traZODone (DESYREL) tablet 50 mg  50 mg Oral QHS PRN Rozetta Nunnery, NP   50 mg at 02/28/18 2127    Lab Results:  Results for orders placed or performed during the hospital encounter of 02/28/18 (from the past 48  hour(s))  Comprehensive metabolic panel     Status: Abnormal   Collection Time: 02/28/18  6:31 PM  Result Value Ref Range   Sodium 133 (L) 135 - 145 mmol/L   Potassium 3.5 3.5 - 5.1 mmol/L   Chloride 99 98 - 111 mmol/L   CO2 28 22 - 32 mmol/L   Glucose, Bld 113 (H) 70 -  99 mg/dL   BUN 20 6 - 20 mg/dL   Creatinine, Ser 1.17 0.61 - 1.24 mg/dL   Calcium 8.3 (L) 8.9 - 10.3 mg/dL   Total Protein 6.9 6.5 - 8.1 g/dL   Albumin 3.3 (L) 3.5 - 5.0 g/dL   AST 185 (H) 15 - 41 U/L   ALT 119 (H) 0 - 44 U/L   Alkaline Phosphatase 44 38 - 126 U/L   Total Bilirubin 0.8 0.3 - 1.2 mg/dL   GFR calc non Af Amer >60 >60 mL/min   GFR calc Af Amer >60 >60 mL/min    Comment: (NOTE) The eGFR has been calculated using the CKD EPI equation. This calculation has not been validated in all clinical situations. eGFR's persistently <60 mL/min signify possible Chronic Kidney Disease.    Anion gap 6 5 - 15    Comment: Performed at [facility], [place] [address] [place], [place] [address]  Ammonia     Status: Abnormal   Collection Time: 02/28/18  6:31 PM  Result Value Ref Range   Ammonia 42 (H) 9 - 35 umol/L    Comment: Performed at [facility], [place] [address], [place], [place] [address]  CBC with Differential/Platelet     Status: Abnormal   Collection Time: 02/28/18  6:31 PM  Result Value Ref Range   WBC 5.8 4.0 - 10.5 K/uL   RBC 4.27 4.22 - 5.81 MIL/uL   Hemoglobin 13.5 13.0 - 17.0 g/dL   HCT 37.5 (L) 39.0 - 52.0 %   MCV 87.8 78.0 - 100.0 fL   MCH 31.6 26.0 - 34.0 pg   MCHC 36.0 30.0 - 36.0 g/dL   RDW 13.1 11.5 - 15.5 %   Platelets 241 150 - 400 K/uL   Neutrophils Relative % 40 %   Neutro Abs 2.3 1.7 - 7.7 K/uL   Lymphocytes Relative 39 %   Lymphs Abs 2.3 0.7 - 4.0 K/uL   Monocytes Relative 12 %   Monocytes Absolute 0.7 0.1 - 1.0 K/uL   Eosinophils Relative 8 %   Eosinophils Absolute 0.5 0.0 - 0.7 K/uL   Basophils Relative 1 %   Basophils Absolute 0.0 0.0 -  0.1 K/uL    Comment: Performed at [facility], [place] [address] [place], [place] [address]    Blood Alcohol level:  Lab Results  Component Value Date   ETH <10 02/15/2018   ETH <10 33/29/5188    Metabolic Disorder Labs: Lab Results  Component Value Date   HGBA1C 5.1 12/31/2017   MPG 99.67 12/31/2017   No results found for: PROLACTIN Lab Results  Component Value Date   CHOL 83 12/31/2017   TRIG 42 12/31/2017   HDL 26 (L) 12/31/2017   CHOLHDL 3.2 12/31/2017   VLDL 8 12/31/2017   LDLCALC 49 12/31/2017    Physical Findings: AIMS: Facial and Oral Movements Muscles of Facial Expression: None, normal Lips and Perioral Area: None, normal Jaw: None, normal Tongue: None, normal,Extremity Movements Upper (arms, wrists, hands, fingers): None, normal Lower (legs, knees, ankles, toes): None, normal, Trunk Movements Neck, shoulders, hips: None, normal, Overall Severity Severity of abnormal movements (highest score from questions above): None, normal Incapacitation due to abnormal movements: None, normal Patient's awareness of abnormal movements (rate only patient's report): No Awareness, Dental Status Current problems with teeth and/or dentures?: No Does patient usually wear dentures?: No  CIWA:  CIWA-Ar Total: 0 COWS:  COWS Total Score: 10  Musculoskeletal: Strength & Muscle  Tone: within normal limits Gait & Station: normal Patient leans: N/A  Psychiatric Specialty Exam: Physical Exam  Nursing note and vitals reviewed. Constitutional: He is oriented to person, place, and time. He appears well-developed and well-nourished.  HENT:  Head: Normocephalic and atraumatic.  Respiratory: Effort normal.  Neurological: He is alert and oriented to person, place, and time.    ROS  Blood pressure 102/74, pulse 68, temperature 98.1 F (36.7 C), temperature source Oral, resp. rate 20, height 6' (1.829 m), weight 85.3 kg.Body mass index is 25.5 kg/m.  General  Appearance: Disheveled  Eye Contact:  Poor  Speech:  Normal Rate  Volume:  Decreased  Mood:  Anxious, Dysphoric and Irritable  Affect:  Congruent  Thought Process:  Coherent and Descriptions of Associations: Intact  Orientation:  Full (Time, Place, and Person)  Thought Content:  Logical  Suicidal Thoughts:  No  Homicidal Thoughts:  No  Memory:  Immediate;   Fair Recent;   Fair Remote;   Fair  Judgement:  Impaired  Insight:  Lacking  Psychomotor Activity:  Increased and Restlessness  Concentration:  Concentration: Fair and Attention Span: Fair  Recall:  [employer] of Knowledge:  Fair  Language:  Fair  Akathisia:  Negative  Handed:  Right  AIMS (if indicated):     Assets:  Desire for Improvement Resilience  ADL's:  Intact  Cognition:  WNL  Sleep:  Number of Hours: 5.75     Treatment Plan Summary: Daily contact with patient to assess and evaluate symptoms and progress in treatment, Medication management and Plan : Patient is seen and examined.  Patient is a 52 year old male with a past psychiatric history significant for polysubstance dependence, polysubstance withdrawal, substance-induced mood disorder, history of major depression.-Continue opiate withdrawal protocol-social work to explore options for residential and substance abuse treatment as an outpatient-No other changes in treatment at this time.-Continue to follow liver function enzymes secondary to elevation.-Monitor ammonia with mild elevation at this time.  Sharma Covert, MD 03/01/2018, 11:40 AM

## 2018-03-01 NOTE — Progress Notes (Signed)
Patient ID: Dwayne Alvarez, male   DOB: [birth year], 52 y.o.   MRN: [id number]  Nursing Progress Note [id number]  Data: Patient presents with sullen mood and withdrawal symtpoms. Patient has been isolative to his room for much of the day but did get up for dinner and evening meds. Patient provided but declined to complete their self-inventory sheet. Patient is able to ambulate with wheelchair with no concerns. Patient currently denies SI/HI/AVH.   Action: Patient educated about and provided medication per provider's orders. Patient safety maintained with q15 min safety checks and frequent rounding. High fall risk precautions in place. Emotional support given. 1:1 interaction and active listening provided. Patient encouraged to attend meals and groups. Patient encouraged to work on treatment plan and goals. Labs, vital signs and patient behavior monitored throughout shift.   Response: Patient agrees to come to staff if any thoughts of SI/HI develop or if patient develops intention of acting on thoughts. Patient remains safe on the unit at this time. Patient is interacting with peers appropriately on the unit. Will continue to support and monitor.
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
## 2018-03-01 NOTE — Progress Notes (Signed)
Recreation Therapy Notes  Date: 03/01/18 Time: 0930 Location: 300 Hall Dayroom  Group Topic: Stress Management  Goal Area(s) Addresses:  Patient will verbalize importance of using healthy stress management.  Patient will identify positive emotions associated with healthy stress management.   Intervention: Stress Management  Activity :  Meditation.  LRT introduced the stress management technique of meditation.  Patients were to listen and follow along as the meditation played in order to relax.  Education:  Stress Management, Discharge Planning.   Education Outcome: Acknowledges edcuation/In group clarification offered/Needs additional education  Clinical Observations/Feedback: Pt did not attend group.     Dwayne Alvarez, LRT/CTRS         Noah Pelaez A 03/01/2018 12:15 PM 

## 2018-03-01 NOTE — Progress Notes (Signed)
Pt has been in his room in bed most of the evening.  He did get up briefly to get a snack, but shortly returned to bed.  He reports moderate withdrawal symptoms for which he was medicated per detox protocol.  He contracts for safety with staff.  He denies HI/AVH.  Pt makes his needs known to staff.  Support and encouragement offered.  Discharge plans are in process.  Pt wishes to go to rehab after detox and is working with the CSW for placement.  Safety maintained with q15 minute checks.

## 2018-03-02 ENCOUNTER — Other Ambulatory Visit: Payer: Medicaid Other | Admitting: Orthotics

## 2018-03-02 DIAGNOSIS — F17210 Nicotine dependence, cigarettes, uncomplicated: Secondary | ICD-10-CM

## 2018-03-02 DIAGNOSIS — F419 Anxiety disorder, unspecified: Secondary | ICD-10-CM

## 2018-03-02 MED ORDER — DULOXETINE HCL 30 MG PO CPEP
30.0000 mg | ORAL_CAPSULE | Freq: Every day | ORAL | Status: DC
  Administered 2018-03-02 – 2018-03-04 (×3): 30 mg via ORAL
  Filled 2018-03-02 (×4): qty 1
  Filled 2018-03-02: qty 21
  Filled 2018-03-02: qty 1
  Filled 2018-03-02: qty 21

## 2018-03-02 NOTE — Progress Notes (Addendum)
[facility] MD Progress Note  03/02/2018 1:01 PM Dwayne Alvarez  MRN:  [id number]   Subjective: Patient reports that he is feeling  better today.  He denies any SI/HI/AVH and contracts for safety at this time. Presents future oriented and is interested in going to [facility] at discharge for a long term residential program . ( Prior to admission was at an [facility] setting). Reports improving but still present symptoms of WDL- feels " shaky", and has some some lingering muscle aches, and abdominal cramping. No vomiting, (+) loose stools .   Describes mood as improving , less depressed. No SI.  Denies medication side effects.  Objective: Patient see and case discussed with treatment team.   52 year old male, was living in an [place], states he had left several days before. Reports he relapsed on  IV heroin several days ago after being sober x 30 days. Presented to ED due to worsening depression, suicidal ideations. At this time patient reports improving mood, feeling better, and denies suicidal ideations. Currently future oriented , more focused on disposition planning, interested in going to [facility] in [place] .  As above, reports mild residual opiate WDL symptoms. Appears calm and in no acute distress- vitals are stable. He also reports improving sleep and appetite.  Limited group participation thus far , isolative in room. States " I have been feeling sick", but reports he is now feeling better. Currently on Cymbalta, and on Clonidine detox protocol . No medication side effects. Denies suicidal ideations Principal Problem: Severe recurrent major depression w/psychotic features, mood-congruent ([place]) Diagnosis:   Patient Active Problem List   Diagnosis Date Noted  . Severe recurrent major depression w/psychotic features, mood-congruent ([place]) [F33.3] 02/28/2018  . Substance induced mood disorder ([place]) [F19.94] 12/30/2017  . MDD (major depressive disorder), recurrent severe,  without psychosis ([place]) [F33.2] 12/30/2017  . Suicidal ideation [R45.851]   . 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 Cocaine use disorder, mild, abuse ([place]) [F14.10]   . Polysubstance dependence including opioid type drug, episodic abuse ([place]) [F11.20, F19.20]    Total Time spent with patient: 20 minutes  Past Psychiatric History: See H&P  Past Medical History:  Past Medical History:  Diagnosis Date  . Hypertension     Past Surgical History:  Procedure Laterality Date  . FOOT AMPUTATION THROUGH METATARSAL Right    Family History: History reviewed. No pertinent family history. Family Psychiatric  History: See H&P Social History:  Social History   Substance and Sexual Activity  Alcohol Use Not Currently     Social History   Substance and Sexual Activity  Drug Use Yes  . Types: Methamphetamines, Cocaine, Oxycodone   Comment: heroin    Social History   Socioeconomic History  . Marital status: Single    Spouse name: Not on file  . Number of children: Not on file  . Years of education: Not on file  . Highest education level: Not on file  Occupational History  . Not on file  Social Needs  . Financial resource strain: Not on file  . Food insecurity:    Worry: Not on file    Inability: Not on file  . Transportation needs:    Medical: Not on file    Non-medical: Not on file  Tobacco Use  . Smoking status: Current Every Day Smoker    Packs/day: 0.50    Years: 30.00    Pack years: 15.00    Types: Cigarettes  . Smokeless tobacco:  Never Used  Substance and Sexual Activity  . Alcohol use: Not Currently  . Drug use: Yes    Types: Methamphetamines, Cocaine, Oxycodone    Comment: heroin  . Sexual activity: Not Currently  Lifestyle  . Physical activity:    Days per week: Not on file    Minutes per session: Not on file  . Stress: Not on file  Relationships  . Social connections:    Talks on phone: Not on file    Gets together: Not on file    Attends  religious service: Not on file    Active member of club or organization: Not on file    Attends meetings of clubs or organizations: Not on file    Relationship status: Not on file  Other Topics Concern  . Not on file  Social History Narrative  . Not on file   Additional Social History:    Pain Medications: Abusing heroin Prescriptions: None Over the Counter: None History of alcohol / drug use?: Yes Withdrawal Symptoms: Sweats, Tremors, Cramps, Tingling, Patient aware of relationship between substance abuse and physical/medical complications Name of Substance 1: Heroin IV 1 - Age of First Use: unknown 1 - Amount (size/oz): 1 gram per day 1 - Frequency: Shooting up 2-3 times in a day 1 - Duration: on-going 1 - Last Use / Amount: 02/27/18 around 01:00 Name of Substance 2: Methamphetamine IV (will use when he can't get heroin) 2 - Age of First Use: Unknown 2 - Amount (size/oz): Will use 1/2 gram 2 - Frequency: Shooting up 2-3 times in a day 2 - Duration: on-going  2 - Last Use / Amount: 02/26/18  Sleep: improving   Appetite:  fair- improving   Current Medications: Current Facility-Administered Medications  Medication Dose Route Frequency Provider Last Rate Last Dose  . acetaminophen (TYLENOL) tablet 650 mg  650 mg Oral Q6H PRN Lindon Romp A, NP      . alum & mag hydroxide-simeth (MAALOX/MYLANTA) 200-200-20 MG/5ML suspension 30 mL  30 mL Oral Q4H PRN Lindon Romp A, NP      . cloNIDine (CATAPRES) tablet 0.1 mg  0.1 mg Oral BH-qamhs Lindon Romp A, NP   0.1 mg at 03/02/18 0748   Followed by  . [START ON 03/04/2018] cloNIDine (CATAPRES) tablet 0.1 mg  0.1 mg Oral QAC breakfast Lindon Romp A, NP      . dicyclomine (BENTYL) tablet 20 mg  20 mg Oral Q6H PRN Lindon Romp A, NP   20 mg at 03/01/18 2128  . hydrOXYzine (ATARAX/VISTARIL) tablet 25 mg  25 mg Oral Q6H PRN Lindon Romp A, NP   25 mg at 03/01/18 2128  . loperamide (IMODIUM) capsule 2-4 mg  2-4 mg Oral PRN Lindon Romp A, NP       . magnesium hydroxide (MILK OF MAGNESIA) suspension 30 mL  30 mL Oral Daily PRN Lindon Romp A, NP      . methocarbamol (ROBAXIN) tablet 500 mg  500 mg Oral Q8H PRN Lindon Romp A, NP   500 mg at 02/28/18 0355  . naproxen (NAPROSYN) tablet 500 mg  500 mg Oral BID PRN Lindon Romp A, NP   500 mg at 03/01/18 2128  . nicotine (NICODERM CQ - dosed in mg/24 hours) patch 14 mg  14 mg Transdermal Daily Lindon Romp A, NP   14 mg at 03/02/18 0748  . ondansetron (ZOFRAN-ODT) disintegrating tablet 4 mg  4 mg Oral Q6H PRN Rozetta Nunnery, NP      .  traZODone (DESYREL) tablet 50 mg  50 mg Oral QHS PRN Rozetta Nunnery, NP   50 mg at 03/01/18 2128    Lab Results:  Results for orders placed or performed during the hospital encounter of 02/28/18 (from the past 48 hour(s))  Comprehensive metabolic panel     Status: Abnormal   Collection Time: 02/28/18  6:31 PM  Result Value Ref Range   Sodium 133 (L) 135 - 145 mmol/L   Potassium 3.5 3.5 - 5.1 mmol/L   Chloride 99 98 - 111 mmol/L   CO2 28 22 - 32 mmol/L   Glucose, Bld 113 (H) 70 - 99 mg/dL   BUN 20 6 - 20 mg/dL   Creatinine, Ser 1.17 0.61 - 1.24 mg/dL   Calcium 8.3 (L) 8.9 - 10.3 mg/dL   Total Protein 6.9 6.5 - 8.1 g/dL   Albumin 3.3 (L) 3.5 - 5.0 g/dL   AST 185 (H) 15 - 41 U/L   ALT 119 (H) 0 - 44 U/L   Alkaline Phosphatase 44 38 - 126 U/L   Total Bilirubin 0.8 0.3 - 1.2 mg/dL   GFR calc non Af Amer >60 >60 mL/min   GFR calc Af Amer >60 >60 mL/min    Comment: (NOTE) The eGFR has been calculated using the CKD EPI equation. This calculation has not been validated in all clinical situations. eGFR's persistently <60 mL/min signify possible Chronic Kidney Disease.    Anion gap 6 5 - 15    Comment: Performed at [facility], [place] [address] [place], [place] [address]  Ammonia     Status: Abnormal   Collection Time: 02/28/18  6:31 PM  Result Value Ref Range   Ammonia 42 (H) 9 - 35 umol/L    Comment: Performed at [facility], [place] [address], [place], [place] [address]  CBC with Differential/Platelet     Status: Abnormal   Collection Time: 02/28/18  6:31 PM  Result Value Ref Range   WBC 5.8 4.0 - 10.5 K/uL   RBC 4.27 4.22 - 5.81 MIL/uL   Hemoglobin 13.5 13.0 - 17.0 g/dL   HCT 37.5 (L) 39.0 - 52.0 %   MCV 87.8 78.0 - 100.0 fL   MCH 31.6 26.0 - 34.0 pg   MCHC 36.0 30.0 - 36.0 g/dL   RDW 13.1 11.5 - 15.5 %   Platelets 241 150 - 400 K/uL   Neutrophils Relative % 40 %   Neutro Abs 2.3 1.7 - 7.7 K/uL   Lymphocytes Relative 39 %   Lymphs Abs 2.3 0.7 - 4.0 K/uL   Monocytes Relative 12 %   Monocytes Absolute 0.7 0.1 - 1.0 K/uL   Eosinophils Relative 8 %   Eosinophils Absolute 0.5 0.0 - 0.7 K/uL   Basophils Relative 1 %   Basophils Absolute 0.0 0.0 - 0.1 K/uL    Comment: Performed at [facility], [place] [address] [place],  [address]    Blood Alcohol level:  Lab Results  Component Value Date   ETH <10 02/15/2018   ETH <10 46/50/3546    Metabolic Disorder Labs: Lab Results  Component Value Date   HGBA1C 5.1 12/31/2017   MPG 99.67 12/31/2017   No results found for: PROLACTIN Lab Results  Component Value Date   CHOL 83 12/31/2017   TRIG 42 12/31/2017   HDL 26 (L) 12/31/2017   CHOLHDL 3.2 12/31/2017   VLDL 8 12/31/2017   LDLCALC 49 12/31/2017    Physical  Findings: AIMS: Facial and Oral Movements Muscles of Facial Expression: None, normal Lips and Perioral Area: None, normal Jaw: None, normal Tongue: None, normal,Extremity Movements Upper (arms, wrists, hands, fingers): None, normal Lower (legs, knees, ankles, toes): None, normal, Trunk Movements Neck, shoulders, hips: None, normal, Overall Severity Severity of abnormal movements (highest score from questions above): None, normal Incapacitation due to abnormal movements: None, normal Patient's awareness of abnormal movements (rate only patient's report): No Awareness, Dental Status Current  problems with teeth and/or dentures?: No Does patient usually wear dentures?: No  CIWA:  CIWA-Ar Total: 0 COWS:  COWS Total Score: 4  Musculoskeletal: Strength & Muscle Tone: within normal limits Gait & Station: normal- mobilizes with wheel chair- history of R foot amputation. States he is able to ambulate with special shoes, but on unit has been moving in wheel chair . Patient leans: N/A  Psychiatric Specialty Exam: Physical Exam  Nursing note and vitals reviewed. Constitutional: He is oriented to person, place, and time. He appears well-developed and well-nourished.  Cardiovascular: Normal rate.  Respiratory: Effort normal.  Musculoskeletal: Normal range of motion.  Right foot amputated  Neurological: He is alert and oriented to person, place, and time.  Skin: Skin is warm.    Review of Systems  Constitutional: Negative.   HENT: Negative.   Eyes: Negative.   Respiratory: Negative.   Cardiovascular: Negative.   Gastrointestinal: Negative.   Genitourinary: Negative.   Musculoskeletal: Negative.   Skin: Negative.   Neurological: Negative.   Endo/Heme/Allergies: Negative.   Psychiatric/Behavioral: Positive for depression and substance abuse. Negative for hallucinations and suicidal ideas. The patient is nervous/anxious.   reports lingering abdominal cramps, loose stools, no vomiting  Blood pressure 115/81, pulse 63, temperature 98.1 F (36.7 C), temperature source Oral, resp. rate 20, height 6' (1.829 m), weight 85.3 kg.Body mass index is 25.5 kg/m.  General Appearance: Fairly Groomed  Eye Contact:  Good  Speech:  Normal Rate  Volume:  Decreased  Mood:  reports feeling better, presents vaguely depressed and dysphoric   Affect:  vaguely constricted, irritable   Thought Process:  Linear and Descriptions of Associations: Intact  Orientation:  Full (Time, Place, and Person)  Thought Content:  No hallucinations, no delusions   Suicidal Thoughts:  No- denies suicidal or self  injurious ideations  Homicidal Thoughts:  No  Memory:  recent and remote grossly intact   Judgement:  Fair  Insight:  Fair  Psychomotor Activity:  Normal- mobilizes in wheel chair, no acute distress or agitation, appears comfortable  Concentration:  Concentration: Good and Attention Span: Good  Recall:  Good  Fund of Knowledge:  Good  Language:  Good  Akathisia:  No  Handed:  Right  AIMS (if indicated):     Assets:  Communication Skills Desire for Improvement Resilience Social Support  ADL's:  Intact  Cognition:  WNL  Sleep:  Number of Hours: 6.25    Assessment- 52 year old male , presented for depression, suicidal ideations, Opiate ( IV Heroin) Abuse . At this time patient reports partially improved mood , states feeling better than prior to admission, denies SI at this time and presents future oriented, expressing interest in going to a residential program in [place] at discharge . Remains dysphoric, vaguely depressed, constricted, isolative. Reports improving but still present symptoms of opiate WDL ( cramps, aches , loose stools) . Has been started  on Cymbalta for depression, anxiety, pain. He denies side effects thus far .    Treatment Plan Summary: Daily  contact with patient to assess and evaluate symptoms and progress in treatment, Medication management and Plan is to:  Treatment Plan reviewed as below today 9/3 Encourage group and milieu participation Encourage efforts to work on sobriety and relapse prevention Continue Clonidine detox to address opiate WDL symptoms Continue Vistaril 25 mg Q 6 hours as needed for anxiety Continue Trazodone 50 mg QHS as needed for insomnia Continue Cymbalta , started at 30 mgrs QDAY, for depression, anxiety  Recheck Ammonia in AM ( was mildly elevated on admission)   F Sharone Picchi MD

## 2018-03-02 NOTE — Plan of Care (Signed)
Progress Note  D: pt found in bed; compliant with medication administration. Pt denies any si/hi/ah/vh and verbally agrees to approach staff if these become apparent. Pt denies any physical pain at this time. Pt has requested to leave because he states he has exhausted all of his options and the ones he hasn't have a waiting list of "at least two weeks".  A: pt provided support and encouragement. [facility] CSW notified of patients situation. Pt given medications per protocol and standing orders. Q41m safety checks implemented and continued. R: pt safe on the unit. Will continue to monitor.   Pt progressing in the following metrics  Problem: Health Behavior/Discharge Planning: Goal: Compliance with treatment plan for underlying cause of condition will improve Outcome: Progressing   Problem: Physical Regulation: Goal: Ability to maintain clinical measurements within normal limits will improve Outcome: Progressing   Problem: Safety: Goal: Periods of time without injury will increase Outcome: Progressing   Problem: Education: Goal: Utilization of techniques to improve thought processes will improve Outcome: Progressing Goal: Knowledge of the prescribed therapeutic regimen will improve Outcome: Progressing   Problem: Coping: Goal: Coping ability will improve Outcome: Progressing

## 2018-03-02 NOTE — BHH Group Notes (Signed)
[facility] Mental Health Association Group Therapy 03/02/2018 1:15pm  Type of Therapy: Mental Health Association Presentation  Participation Level: Active  Participation Quality: Attentive  Affect: Appropriate  Cognitive: Oriented  Insight: Developing/Improving  Engagement in Therapy: Engaged  Modes of Intervention: Discussion, Education and Socialization  Summary of Progress/Problems: Mental Health Association (MHA) Speaker came to talk about his personal journey with mental health. The pt processed ways by which to relate to the speaker. MHA speaker provided handouts and educational information pertaining to groups and services offered by the [facility]. Pt was engaged in speaker's presentation and was receptive to resources provided.    Rona Ravens, LCSW 03/02/2018 3:43 PM

## 2018-03-02 NOTE — Progress Notes (Signed)
[facility] Group Notes:  (Nursing/MHT/Case Management/Adjunct)  Date:  03/02/2018  Time:  4:00 PM  Type of Therapy:  Nurse Education  Participation Level:  Did Not Attend  Summary of Progress/Problems: Patient was invited but did not attend.  Dwayne Alvarez 03/02/2018, 5:50 PM 

## 2018-03-03 ENCOUNTER — Encounter (HOSPITAL_COMMUNITY): Payer: Self-pay | Admitting: Behavioral Health

## 2018-03-03 LAB — COMPREHENSIVE METABOLIC PANEL
ALT: 207 U/L — ABNORMAL HIGH (ref 0–44)
ANION GAP: 6 (ref 5–15)
AST: 265 U/L — ABNORMAL HIGH (ref 15–41)
Albumin: 3.5 g/dL (ref 3.5–5.0)
Alkaline Phosphatase: 50 U/L (ref 38–126)
BUN: 14 mg/dL (ref 6–20)
CO2: 27 mmol/L (ref 22–32)
Calcium: 8.8 mg/dL — ABNORMAL LOW (ref 8.9–10.3)
Chloride: 107 mmol/L (ref 98–111)
Creatinine, Ser: 0.88 mg/dL (ref 0.61–1.24)
Glucose, Bld: 111 mg/dL — ABNORMAL HIGH (ref 70–99)
Potassium: 4.2 mmol/L (ref 3.5–5.1)
Sodium: 140 mmol/L (ref 135–145)
TOTAL PROTEIN: 7.4 g/dL (ref 6.5–8.1)
Total Bilirubin: 0.7 mg/dL (ref 0.3–1.2)

## 2018-03-03 LAB — AMMONIA: AMMONIA: 26 umol/L (ref 9–35)

## 2018-03-03 NOTE — Progress Notes (Signed)
Recreation Therapy Notes  Date: 9.4.19 Time: 0930 Location: [address] Dayroom  Group Topic: Stress Management  Goal Area(s) Addresses:  Patient will verbalize importance of using healthy stress management.  Patient will identify positive emotions associated with healthy stress management.   Intervention: Stress Management  Activity :  Guided Imagery.  LRT introduced the stress management technique of guided imagery.  LRT read Alvarez script that allowed patients to enjoy the summer clouds.  Patients were to follow along as LRT read script to engage in activity.  Education:  Stress Management, Discharge Planning.   Education Outcome: Acknowledges edcuation/In group clarification offered/Needs additional education  Clinical Observations/Feedback: Pt did not attend group.     Dwayne Alvarez, LRT/CTRS         Dwayne Alvarez 03/03/2018 10:55 AM 

## 2018-03-03 NOTE — Progress Notes (Signed)
D:  Patient denied SI and HI, contracts for safety.  Denied A/V hallucinations.  Patient has been ambulating in wheelchair in hallway today.  Patient has been on phone several times throughout the day.  Patient has not attended groups today. A:  Medications administered per MD orders.  Emotional support and encouragement given patient. R:  Safety maintained with 15 minute checks.

## 2018-03-03 NOTE — Plan of Care (Signed)
Nurse discussed anxiety, depression, coping skills with patient. 

## 2018-03-03 NOTE — [facility] Group Notes (Signed)
[facility] Group Notes:  (Nursing/MHT/Case Management/Adjunct)  Date:  03/03/2018  Time:  4:00 pm  Type of Therapy:  Psychoeducational Skills  Participation Level:  Active  Participation Quality:  Appropriate  Affect:  Appropriate  Cognitive:  Appropriate  Insight:  Appropriate  Engagement in Group:  Engaged  Modes of Intervention:  Education  Summary of Progress/Problems: Patient was alert and participated appropriately in group.     Quintella Reichert [place] 03/03/2018, 6:03 PM

## 2018-03-03 NOTE — Progress Notes (Addendum)
Patient has been accepted to [facility] for residential treatment on 03/04/18 at 11:00am.   Patient will need a 21 day supply of medications.   CSW will notify the patient's MD and RN staff.   Baldo Daub, MSW, LCSWA Clinical Social Worker [facility]  Phone: [phone]

## 2018-03-03 NOTE — Progress Notes (Addendum)
[facility] MD Progress Note  03/03/2018 2:47 PM Dwayne Alvarez  MRN:  [id number]   Subjective: " I feel better I just want to get out of here."     Evaluation on the unit: Face to face evaluation/rounding completed by writer and MD. 52 year old male, was living in an [place], states he had left several days before. Reports he relapsed on  IV heroin several days ago after being sober x 30 days. Presented to ED due to worsening depression, suicidal ideations.  On evaluation, patient continues to endorse improved mood. He states that he is hopeful to go to the [employer] in [place] or [place] which he has had a phone interview. He denies any suicidal thoughts, homicidal ideas or psychosis. He is not complaining of any  opiate WDL symptoms. He endorses no concerns with appetite or resting pattern. As per staff, patient participation in group sessions remains limited and he remains isolated. He denies somatic complaints or acute pain. He is contracting for safety at this time.       Principal Problem: Severe recurrent major depression w/psychotic features, mood-congruent ([place]) Diagnosis:   Patient Active Problem List   Diagnosis Date Noted  . Severe recurrent major depression w/psychotic features, mood-congruent ([place]) [F33.3] 02/28/2018  . Substance induced mood disorder ([place]) [F19.94] 12/30/2017  . MDD (major depressive disorder), recurrent severe, without psychosis ([employer]) [F33.2] 12/30/2017  . Suicidal ideation [R45.851]   . Methamphetamine dependence ([place]) [F15.20]   . Cocaine use disorder, mild, abuse ([place]) [F14.10]   . Polysubstance dependence including opioid type drug, episodic abuse ([place]) [F11.20, F19.20]    Total Time spent with patient: 20 minutes  Past Psychiatric History: See H&P  Past Medical History:  Past Medical History:  Diagnosis Date  . Hypertension     Past Surgical History:  Procedure Laterality Date  . FOOT AMPUTATION THROUGH METATARSAL Right    Family History:  History reviewed. No pertinent family history. Family Psychiatric  History: See H&P Social History:  Social History   Substance and Sexual Activity  Alcohol Use Not Currently     Social History   Substance and Sexual Activity  Drug Use Yes  . Types: Methamphetamines, Cocaine, Oxycodone   Comment: heroin    Social History   Socioeconomic History  . Marital status: Single    Spouse name: Not on file  . Number of children: Not on file  . Years of education: Not on file  . Highest education level: Not on file  Occupational History  . Not on file  Social Needs  . Financial resource strain: Not on file  . Food insecurity:    Worry: Not on file    Inability: Not on file  . Transportation needs:    Medical: Not on file    Non-medical: Not on file  Tobacco Use  . Smoking status: Current Every Day Smoker    Packs/day: 0.50    Years: 30.00    Pack years: 15.00    Types: Cigarettes  . Smokeless tobacco: Never Used  Substance and Sexual Activity  . Alcohol use: Not Currently  . Drug use: Yes    Types: Methamphetamines, Cocaine, Oxycodone    Comment: heroin  . Sexual activity: Not Currently  Lifestyle  . Physical activity:    Days per week: Not on file    Minutes per session: Not on file  . Stress: Not on file  Relationships  . Social connections:    Talks on phone: Not on  file    Gets together: Not on file    Attends religious service: Not on file    Active member of club or organization: Not on file    Attends meetings of clubs or organizations: Not on file    Relationship status: Not on file  Other Topics Concern  . Not on file  Social History Narrative  . Not on file   Additional Social History:    Pain Medications: Abusing heroin Prescriptions: None Over the Counter: None History of alcohol / drug use?: Yes Withdrawal Symptoms: Sweats, Tremors, Cramps, Tingling, Patient aware of relationship between substance abuse and physical/medical complications Name  of Substance 1: Heroin IV 1 - Age of First Use: unknown 1 - Amount (size/oz): 1 gram per day 1 - Frequency: Shooting up 2-3 times in a day 1 - Duration: on-going 1 - Last Use / Amount: 02/27/18 around 01:00 Name of Substance 2: Methamphetamine IV (will use when he can't get heroin) 2 - Age of First Use: Unknown 2 - Amount (size/oz): Will use 1/2 gram 2 - Frequency: Shooting up 2-3 times in a day 2 - Duration: on-going  2 - Last Use / Amount: 02/26/18  Sleep: Fair  Appetite:  fair- improving   Current Medications: Current Facility-Administered Medications  Medication Dose Route Frequency Provider Last Rate Last Dose  . acetaminophen (TYLENOL) tablet 650 mg  650 mg Oral Q6H PRN Lindon Romp A, NP      . alum & mag hydroxide-simeth (MAALOX/MYLANTA) 200-200-20 MG/5ML suspension 30 mL  30 mL Oral Q4H PRN Lindon Romp A, NP      . cloNIDine (CATAPRES) tablet 0.1 mg  0.1 mg Oral BH-qamhs Lindon Romp A, NP   0.1 mg at 03/03/18 0753   Followed by  . [START ON 03/04/2018] cloNIDine (CATAPRES) tablet 0.1 mg  0.1 mg Oral QAC breakfast Lindon Romp A, NP      . dicyclomine (BENTYL) tablet 20 mg  20 mg Oral Q6H PRN Lindon Romp A, NP   20 mg at 03/02/18 2133  . DULoxetine (CYMBALTA) DR capsule 30 mg  30 mg Oral Daily Cobos, Myer Peer, MD   30 mg at 03/03/18 0753  . hydrOXYzine (ATARAX/VISTARIL) tablet 25 mg  25 mg Oral Q6H PRN Lindon Romp A, NP   25 mg at 03/03/18 0757  . loperamide (IMODIUM) capsule 2-4 mg  2-4 mg Oral PRN Lindon Romp A, NP      . magnesium hydroxide (MILK OF MAGNESIA) suspension 30 mL  30 mL Oral Daily PRN Lindon Romp A, NP      . methocarbamol (ROBAXIN) tablet 500 mg  500 mg Oral Q8H PRN Lindon Romp A, NP   500 mg at 03/02/18 2133  . naproxen (NAPROSYN) tablet 500 mg  500 mg Oral BID PRN Rozetta Nunnery, NP   500 mg at 03/02/18 2133  . nicotine (NICODERM CQ - dosed in mg/24 hours) patch 14 mg  14 mg Transdermal Daily Lindon Romp A, NP   14 mg at 03/03/18 0754  . ondansetron  (ZOFRAN-ODT) disintegrating tablet 4 mg  4 mg Oral Q6H PRN Lindon Romp A, NP      . traZODone (DESYREL) tablet 50 mg  50 mg Oral QHS PRN Rozetta Nunnery, NP   50 mg at 03/02/18 2133    Lab Results:  Results for orders placed or performed during the hospital encounter of 02/28/18 (from the past 48 hour(s))  Comprehensive metabolic panel     Status: Abnormal  Collection Time: 03/03/18  6:31 AM  Result Value Ref Range   Sodium 140 135 - 145 mmol/L   Potassium 4.2 3.5 - 5.1 mmol/L   Chloride 107 98 - 111 mmol/L   CO2 27 22 - 32 mmol/L   Glucose, Bld 111 (H) 70 - 99 mg/dL   BUN 14 6 - 20 mg/dL   Creatinine, Ser 0.88 0.61 - 1.24 mg/dL   Calcium 8.8 (L) 8.9 - 10.3 mg/dL   Total Protein 7.4 6.5 - 8.1 g/dL   Albumin 3.5 3.5 - 5.0 g/dL   AST 265 (H) 15 - 41 U/L   ALT 207 (H) 0 - 44 U/L   Alkaline Phosphatase 50 38 - 126 U/L   Total Bilirubin 0.7 0.3 - 1.2 mg/dL   GFR calc non Af Amer >60 >60 mL/min   GFR calc Af Amer >60 >60 mL/min    Comment: (NOTE) The eGFR has been calculated using the CKD EPI equation. This calculation has not been validated in all clinical situations. eGFR's persistently <60 mL/min signify possible Chronic Kidney Disease.    Anion gap 6 5 - 15    Comment: Performed at [facility], [place] [address] [place], [place] [address]    Blood Alcohol level:  Lab Results  Component Value Date   ETH <10 02/15/2018   ETH <10 60/45/4098    Metabolic Disorder Labs: Lab Results  Component Value Date   HGBA1C 5.1 12/31/2017   MPG 99.67 12/31/2017   No results found for: PROLACTIN Lab Results  Component Value Date   CHOL 83 12/31/2017   TRIG 42 12/31/2017   HDL 26 (L) 12/31/2017   CHOLHDL 3.2 12/31/2017   VLDL 8 12/31/2017   LDLCALC 49 12/31/2017    Physical Findings: AIMS: Facial and Oral Movements Muscles of Facial Expression: None, normal Lips and Perioral Area: None, normal Jaw: None, normal Tongue: None, normal,Extremity  Movements Upper (arms, wrists, hands, fingers): None, normal Lower (legs, knees, ankles, toes): None, normal, Trunk Movements Neck, shoulders, hips: None, normal, Overall Severity Severity of abnormal movements (highest score from questions above): None, normal Incapacitation due to abnormal movements: None, normal Patient's awareness of abnormal movements (rate only patient's report): No Awareness, Dental Status Current problems with teeth and/or dentures?: No Does patient usually wear dentures?: No  CIWA:  CIWA-Ar Total: 1 COWS:  COWS Total Score: 2  Musculoskeletal: Strength & Muscle Tone: within normal limits Gait & Station: normal- mobilizes with wheel chair- history of R foot amputation. States he is able to ambulate with special shoes, but on unit has been moving in wheel chair . Patient leans: N/A  Psychiatric Specialty Exam: Physical Exam  Nursing note and vitals reviewed. Constitutional: He is oriented to person, place, and time. He appears well-developed and well-nourished.  Cardiovascular: Normal rate.  Respiratory: Effort normal.  Musculoskeletal: Normal range of motion.  Right foot amputated  Neurological: He is alert and oriented to person, place, and time.  Skin: Skin is warm.    Review of Systems  Constitutional: Negative.   HENT: Negative.   Eyes: Negative.   Respiratory: Negative.   Cardiovascular: Negative.   Gastrointestinal: Negative.   Genitourinary: Negative.   Musculoskeletal: Negative.   Skin: Negative.   Neurological: Negative.   Endo/Heme/Allergies: Negative.   Psychiatric/Behavioral: Positive for depression and substance abuse. Negative for hallucinations and suicidal ideas. The patient is nervous/anxious.   reports lingering abdominal cramps, loose stools, no vomiting  Blood pressure 130/88, pulse (!) 58, temperature  97.9 F (36.6 C), temperature source Oral, resp. rate 16, height 6' (1.829 m), weight 85.3 kg.Body mass index is 25.5 kg/m.   General Appearance: Fairly Groomed  Eye Contact:  Good  Speech:  Normal Rate  Volume:  Decreased  Mood:  reports feeling better, presents vaguely depressed and dysphoric   Affect:  vaguely constricted, irritable   Thought Process:  Linear and Descriptions of Associations: Intact  Orientation:  Full (Time, Place, and Person)  Thought Content:  No hallucinations, no delusions   Suicidal Thoughts:  No- denies suicidal or self injurious ideations  Homicidal Thoughts:  No  Memory:  recent and remote grossly intact   Judgement:  Fair  Insight:  Fair  Psychomotor Activity:  Normal- mobilizes in wheel chair, no acute distress or agitation, appears comfortable  Concentration:  Concentration: Good and Attention Span: Good  Recall:  Good  Fund of Knowledge:  Good  Language:  Good  Akathisia:  No  Handed:  Right  AIMS (if indicated):     Assets:  Communication Skills Desire for Improvement Resilience Social Support  ADL's:  Intact  Cognition:  WNL  Sleep:  Number of Hours: 6.75       Treatment Plan Summary: Reviewed current treatment plan. Will continue the following plan without adjustments at this time.  Daily contact with patient to assess and evaluate symptoms and progress in treatment, Medication management and Plan is to:  Treatment Plan reviewed as below today 9/3 Encourage group and milieu participation Encourage efforts to work on sobriety and relapse prevention Continue Clonidine detox to address opiate WDL symptoms Continue Vistaril 25 mg Q 6 hours as needed for anxiety Continue Trazodone 50 mg QHS as needed for insomnia Continue Cymbalta , started at 30 mgrs QDAY, for depression, anxiety  Rechecked Ammonia  ( was mildly elevated on admission) and results are not back.   Mordecai Maes, FNP-C  Patient ID: Dwayne Alvarez, male   DOB: 1965-09-26, 52 y.o.   MRN: [id number] .Marland KitchenAgree with NP Progress Note

## 2018-03-03 NOTE — Progress Notes (Signed)
Pt reports he is doing a little better.  He says he is still having some withdrawal symptoms, but mostly c/o pain which he says is chronic.  He stays to himself usually in his bed, he did come to the dayroom to get his snack tonight.  He denies SI/HI/AVH.  He says he will probably discharge to a shelter.  He makes his needs known to staff.  He is cooperative and pleasant.  Support and encouragement offered.  Discharge plans are in process.  Safety maintained with q15 minute safety checks.

## 2018-03-03 NOTE — [facility] Group Notes (Signed)
LCSW Group Therapy Note 03/03/2018 11:33 AM  Type of Therapy and Topic: Group Therapy: Overcoming Obstacles  Participation Level: Did Not Attend  Description of Group:  In this group patients will be encouraged to explore what they see as obstacles to their own wellness and recovery. They will be guided to discuss their thoughts, feelings, and behaviors related to these obstacles. The group will process together ways to cope with barriers, with attention given to specific choices patients can make. Each patient will be challenged to identify changes they are motivated to make in order to overcome their obstacles. This group will be process-oriented, with patients participating in exploration of their own experiences as well as giving and receiving support and challenge from other group members.  Therapeutic Goals: 1. Patient will identify personal and current obstacles as they relate to admission. 2. Patient will identify barriers that currently interfere with their wellness or overcoming obstacles.  3. Patient will identify feelings, thought process and behaviors related to these barriers. 4. Patient will identify two changes they are willing to make to overcome these obstacles:   Summary of Patient Progress  Invited, chose not to attend.    Therapeutic Modalities:  Cognitive Behavioral Therapy Solution Focused Therapy Motivational Interviewing Relapse Prevention Therapy   Beauty Pless LCSWA Clinical Social Worker   

## 2018-03-04 MED ORDER — TRAZODONE HCL 50 MG PO TABS: 50.0000 mg | ORAL_TABLET | Freq: Every evening | ORAL | 0 refills | Status: AC | PRN

## 2018-03-04 MED ORDER — DULOXETINE HCL 30 MG PO CPEP: 30.0000 mg | ORAL_CAPSULE | Freq: Every day | ORAL | 0 refills | Status: DC

## 2018-03-04 MED ORDER — HYDROXYZINE HCL 25 MG PO TABS: 25.0000 mg | ORAL_TABLET | Freq: Four times a day (QID) | ORAL | 0 refills | Status: AC | PRN

## 2018-03-04 MED ORDER — CLONIDINE HCL 0.1 MG PO TABS: 0.1000 mg | ORAL_TABLET | Freq: Every day | ORAL | 0 refills | Status: AC

## 2018-03-04 MED ORDER — NICOTINE 14 MG/24HR TD PT24: 14.0000 mg | MEDICATED_PATCH | Freq: Every day | TRANSDERMAL | 0 refills | Status: DC

## 2018-03-04 NOTE — [facility] Suicide Risk Assessment (Addendum)
[facility] Discharge Suicide Risk Assessment   Principal Problem: Severe recurrent major depression w/psychotic features, mood-congruent [facility]) Discharge Diagnoses:  Patient Active Problem List   Diagnosis Date Noted  . Severe recurrent major depression w/psychotic features, mood-congruent ([facility]) [F33.3] 02/28/2018  . Substance induced mood disorder ([facility]) [F19.94] 12/30/2017  . MDD (major depressive disorder), recurrent severe, without psychosis ([facility]) [F33.2] 12/30/2017  . Suicidal ideation [R45.851]   . 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 Cocaine use disorder, mild, abuse (HCC) [F14.10]   . Polysubstance dependence including opioid type drug, episodic abuse ([facility]) [F11.20, F19.20]     Total Time spent with patient: 30 minutes  Musculoskeletal: Strength & Muscle Tone: within normal limits Gait & Station: normal Patient leans: N/A  Psychiatric Specialty Exam: ROS no chest pain, no shortness of breath, no vomiting , no fever, no chills   Blood pressure 130/88, pulse (!) 58, temperature 97.9 F (36.6 C), temperature source Oral, resp. rate 16, height 6' (1.829 m), weight 85.3 kg.Body mass index is 25.5 kg/m.  General Appearance: Fairly Groomed  Patent attorney::  Good  Speech:  Normal Rate409  Volume:  Normal  Mood:  reports he is feeling "OK", currently denies feeling depressed   Affect:  vaguely irritable, reactive   Thought Process:  Linear and Descriptions of Associations: Intact  Orientation:  Full (Time, Place, and Person)- he is fully oriented x 3   Thought Content:  denies hallucinations, no delusions expressed   Suicidal Thoughts:  No denies suicidal or self injurious ideations, denies homicidal or violent ideations  Homicidal Thoughts:  No  Memory:  recent and remote grossly intact   Judgement:  Other:  fair- improving   Insight:  fair- improving   Psychomotor Activity:  Normal- no psychomotor agitation or restlessness   Concentration:  Fair  Recall:  Good  Fund of  Knowledge:Good  Language: Good  Akathisia:  Negative  Handed:  Right  AIMS (if indicated):     Assets:  Desire for Improvement Resilience  Sleep:  Number of Hours: 6.75  Cognition: WNL  ADL's:  Intact   Mental Status Per Nursing Assessment::   On Admission:  Suicidal ideation indicated by patient, Self-harm thoughts  Demographic Factors:  52 year old male   Loss Factors: Substance abuse / homelessness   Historical Factors: History of substance abuse ( methamphetamine , cocaine, opiates) History of depression  Risk Reduction Factors:   Positive coping skills or problem solving skills  Continued Clinical Symptoms:  At this time patient is alert, attentive, calm, reports mood as improved, denies feeling depressed at this time, affect vaguely irritable, no thought disorder, no suicidal or self injurious ideations, no homicidal or violent ideations, no hallucinations, no delusions, not internally preoccupied . Denies medication side effects. No disruptive or agitated behaviors , cooperative on approach * patient has elevated LFTs , denies associated symptoms. Ammonia level WNL. History of Hep C (+) . We reviewed importance of outpatient management / treatment for this condition.  Cognitive Features That Contribute To Risk:  No gross cognitive deficits noted upon discharge. Is alert , attentive, and oriented x 3   Suicide Risk:  Mild:  Suicidal ideation of limited frequency, intensity, duration, and specificity.  There are no identifiable plans, no associated intent, mild dysphoria and related symptoms, good self-control (both objective and subjective assessment), few other risk factors, and identifiable protective factors, including available and accessible social support.  Follow-up Information    [facility] Follow up on 03/11/2018.   Specialty:  Behavioral Health Why:  Walk-in hours for medication management and therapy services are Monday-Friday from 8am-5pm. Please bring the  following if you have them: Photo ID, social security card, any proof of income, and hospital discharge paperwork.  [address] [place] [address] [phone]        [facility] Follow up.   Specialty:  Addiction Medicine Why:  Accepted for treatment on 03/04/18.  Contact information: [address] [place] [address] [phone]           Plan Of Care/Follow-up recommendations:  Activity:  as tolerated  Diet:  regular Tests:  NA Other:  see below Patient is going to [facility] for inpatient rehab treatment Craige Cotta, MD 03/04/2018, 10:31 AM

## 2018-03-04 NOTE — Discharge Summary (Addendum)
Physician Discharge Summary Note  Patient:  Dwayne Alvarez is an 52 y.o., male MRN:  [id number] DOB:  08/25/1965  Patient phone:  [phone] (home)   Patient address:   [address] [place] [address],   Total Time spent with patient: Greater than 30 minutes  Date of Admission:  02/28/2018  Date of Discharge: 03/04/2018  Reason for Admission: Worsening symptoms of depression & suicidal ideations.  Principal Problem: Severe recurrent major depression w/psychotic features, mood-congruent [facility])  Discharge Diagnoses: Patient Active Problem List   Diagnosis Date Noted  . Severe recurrent major depression w/psychotic features, mood-congruent (HCC) [F33.3] 02/28/2018  . Substance induced mood disorder (HCC) [F19.94] 12/30/2017  . MDD (major depressive disorder), recurrent severe, without psychosis (HCC) [F33.2] 12/30/2017  . Suicidal ideation [R45.851]   . 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 Cocaine use disorder, mild, abuse (HCC) [F14.10]   . Polysubstance dependence including opioid type drug, episodic abuse (HCC) [F11.20, F19.20]    Past Medical History:  Past Medical History:  Diagnosis Date  . Hypertension     Past Surgical History:  Procedure Laterality Date  . FOOT AMPUTATION THROUGH METATARSAL Right    Family History: History reviewed. No pertinent family history.  Family Psychiatric  History: See H&P  Social History:  Social History   Substance and Sexual Activity  Alcohol Use Not Currently     Social History   Substance and Sexual Activity  Drug Use Yes  . Types: Methamphetamines, Cocaine, Oxycodone   Comment: heroin    Social History   Socioeconomic History  . Marital status: Single    Spouse name: Not on file  . Number of children: Not on file  . Years of education: Not on file  . Highest education level: Not on file  Occupational History  . Not on file  Social Needs  . Financial resource strain: Not on file  . Food  insecurity:    Worry: Not on file    Inability: Not on file  . Transportation needs:    Medical: Not on file    Non-medical: Not on file  Tobacco Use  . Smoking status: Current Every Day Smoker    Packs/day: 0.50    Years: 30.00    Pack years: 15.00    Types: Cigarettes  . Smokeless tobacco: Never Used  Substance and Sexual Activity  . Alcohol use: Not Currently  . Drug use: Yes    Types: Methamphetamines, Cocaine, Oxycodone    Comment: heroin  . Sexual activity: Not Currently  Lifestyle  . Physical activity:    Days per week: Not on file    Minutes per session: Not on file  . Stress: Not on file  Relationships  . Social connections:    Talks on phone: Not on file    Gets together: Not on file    Attends religious service: Not on file    Active member of club or organization: Not on file    Attends meetings of clubs or organizations: Not on file    Relationship status: Not on file  Other Topics Concern  . Not on file  Social History Narrative  . Not on file   Hospital Course: (Per Md's admission SRA):Patient is a 52 year old male with a past psychiatric history significant for opiate dependence, methamphetamine dependence and cocaine dependence who presented to the [facility] emergency department on 02/27/2018 with suicidal ideation. The patient said he had been approached by [facility] police when he was walking  down the road.  He walks very unsteadily, so the police spoke to him.  He told police he needed to be seen by mental health and that he wanted to kill himself.  He told police he was going to overdose on heroin.  He stated he had been using IV heroin at least 1 g/day 2-3 times a day.  The patient had recently been hospitalized at the behavioral health hospital on 12/30/2017.  At that time patient had been living in a halfway house in [place], and that he wanted to get sober, but there were drugs in the facility.  He left there and came to the hospital.  He is been  in multiple halfway houses over the last several months.  At the discharge from the hospital in July [facility]  He stated that he had remained sober for 2 to 3 weeks, but then started using again.  He stated after [facility] he went to an [place] [facility], but then left there after he relapsed on heroin.  He stated he left there 2 to 3 days ago.  It is unclear how he got to [facility] from the [place] area.    After the above admission assessment, Dwayne Alvarez was started on the medication regimen for his presenting symptoms. He received & was discharged on; Duloxetine 30 mg for depression, Vistaril 25 mg prn for anxiety, Nicotine patch 14 mg for smoking cessation & Trazodone 50 mg prn for insomnia. He was enrolled in & participated in the group counseling sessions being offered & held on this unit. He learned coping skills. He was resumed & discharged on all his pertinent home medications for his other medical issues presented. He tolerated his treatment regimen without any adverse effects or reactions reported.   Today upon discharge, this patient is alert, attentive, calm, reports mood as improved, denies feeling depressed at this time, affect vaguely irritable, no thought disorder, no suicidal or self injurious ideations, no homicidal or violent ideations, no hallucinations, no delusions, not internally preoccupied. Denies medication side effects. No disruptive or agitated behaviors, cooperative on approach. Patient has elevated LFTs, denies associated symptoms. Ammonia level WNL. History of Hep C (+) . We reviewed importance of outpatient management/treatment for this condition. He left [facility] in no apparent distress.   Physical Findings: AIMS: Facial and Oral Movements Muscles of Facial Expression: None, normal Lips and Perioral Area: None, normal Jaw: None, normal Tongue: None, normal,Extremity Movements Upper (arms, wrists, hands, fingers): None,  normal Lower (legs, knees, ankles, toes): None, normal, Trunk Movements Neck, shoulders, hips: None, normal, Overall Severity Severity of abnormal movements (highest score from questions above): None, normal Incapacitation due to abnormal movements: None, normal Patient's awareness of abnormal movements (rate only patient's report): No Awareness, Dental Status Current problems with teeth and/or dentures?: No Does patient usually wear dentures?: No  CIWA:  CIWA-Ar Total: 1 COWS:  COWS Total Score: 1  Musculoskeletal: Strength & Muscle Tone: within normal limits Gait & Station: normal Patient leans: N/A  Psychiatric Specialty Exam: Physical Exam  Nursing note and vitals reviewed. Constitutional: He appears well-developed.  HENT:  Head: Normocephalic.  Eyes: Pupils are equal, round, and reactive to light.  Neck: Normal range of motion.  Cardiovascular: Normal rate.  Respiratory: Effort normal.  GI: Soft.  Genitourinary:  Genitourinary Comments: Deferred  Musculoskeletal: Normal range of motion.  Neurological: He is alert.  Skin: Skin is warm.    Review of  Systems  Constitutional: Negative.   HENT: Negative.   Eyes: Negative.   Respiratory: Negative.   Cardiovascular: Negative.   Gastrointestinal: Negative.   Genitourinary: Negative.   Musculoskeletal: Negative.   Skin: Negative.   Neurological: Negative.   Endo/Heme/Allergies: Negative.   Psychiatric/Behavioral: Positive for depression (Stable) and substance abuse. Negative for suicidal ideas.    Blood pressure 130/88, pulse (!) 58, temperature 97.9 F (36.6 C), temperature source Oral, resp. rate 16, height 6' (1.829 m), weight 85.3 kg.Body mass index is 25.5 kg/m.  Sleep:  Number of Hours: 6.75   Has this patient used any form of tobacco in the last 30 days? (Cigarettes, Smokeless Tobacco, Cigars, and/or Pipes): Yes, an FDA-approved tobacco cessation medication was offered at discharge.  Blood Alcohol level:  Lab  Results  Component Value Date   ETH <10 02/15/2018   ETH <10 12/30/2017   Metabolic Disorder Labs:  Lab Results  Component Value Date   HGBA1C 5.1 12/31/2017   MPG 99.67 12/31/2017   No results found for: PROLACTIN Lab Results  Component Value Date   CHOL 83 12/31/2017   TRIG 42 12/31/2017   HDL 26 (L) 12/31/2017   CHOLHDL 3.2 12/31/2017   VLDL 8 12/31/2017   LDLCALC 49 12/31/2017   See Psychiatric Specialty Exam and Suicide Risk Assessment completed by Attending Physician prior to discharge.  Discharge destination:  Home  Is patient on multiple antipsychotic therapies at discharge:  No   Has Patient had three or more failed trials of antipsychotic monotherapy by history:  No  Recommended Plan for Multiple Antipsychotic Therapies: NA  Allergies as of 03/04/2018   No Known Allergies     Medication List    TAKE these medications     Indication  cloNIDine 0.1 MG tablet Commonly known as:  CATAPRES Take 1 tablet (0.1 mg total) by mouth daily before breakfast. For high blood pressure Start taking on:  03/05/2018  Indication:  High Blood Pressure Disorder   DULoxetine 30 MG capsule Commonly known as:  CYMBALTA Take 1 capsule (30 mg total) by mouth daily. For depression Start taking on:  03/05/2018  Indication:  Major Depressive Disorder   hydrOXYzine 25 MG tablet Commonly known as:  ATARAX/VISTARIL Take 1 tablet (25 mg total) by mouth every 6 (six) hours as needed for anxiety.  Indication:  Feeling Anxious   nicotine 14 mg/24hr patch Commonly known as:  NICODERM CQ - dosed in mg/24 hours Place 1 patch (14 mg total) onto the skin daily. (may buy from over the counter): For smoking cessation Start taking on:  03/05/2018  Indication:  Nicotine Addiction   traZODone 50 MG tablet Commonly known as:  DESYREL Take 1 tablet (50 mg total) by mouth at bedtime as needed for sleep.  Indication:  Trouble Sleeping      Follow-up Information    [facility] Follow up on 03/11/2018.    Specialty:  Behavioral Health Why:  Walk-in hours for medication management and therapy services are Monday-Friday from 8am-5pm. Please bring the following if you have them: Photo ID, social security card, any proof of income, and hospital discharge paperwork.  [address] [place] [address] [phone]        [facility] Follow up.   Specialty:  Addiction Medicine Why:  Accepted for treatment on 03/04/18.  Contact information: [address] [place] [address] [phone]        [facility]             .  Go on 03/08/2018.   Why:  Ps follow-up at the Infectious disease clinic on Monday 03-08-18 as scheduled; You have tested hepatitic C positive. Contact information: [address] [place] [address]       [facility] .          Follow-up recommendations:Activity:  As tolerated Diet: As recommended by your primary care doctor. Keep all scheduled follow-up appointments as recommended.   Comments: Patient is instructed prior to discharge to: Take all medications as prescribed by his/her mental healthcare provider. Report any adverse effects and or reactions from the medicines to his/her outpatient provider promptly. Patient has been instructed & cautioned: To not engage in alcohol and or illegal drug use while on prescription medicines. In the event of worsening symptoms, patient is instructed to call the crisis hotline, 911 and or go to the nearest ED for appropriate evaluation and treatment of symptoms. To follow-up with his/her primary care provider for your other medical issues, concerns and or health care needs.   Signed: Armandina Stammer, NP, PMHNP, FNP-BC 03/04/2018, 11:19 AM Patient seen, Suicide Assessment Completed.  Disposition Plan Reviewed

## 2018-03-04 NOTE — Progress Notes (Signed)
Pt in room in bed c/o generalized fatigue.  Pt denies SI, HI and AVH and contracts for safety.  Pt sts he is accepted to [facility] and will leave soon.  Pt is med compliant, gets a snack and returns to bed. Pt remains safe on unit.

## 2018-03-04 NOTE — Progress Notes (Signed)
  [facility] Adult Case Management Discharge Plan :  Will you be returning to the same living situation after discharge:  No. Patient is discharging to [facility] for residential treatment.  At discharge, do you have transportation home?: Yes,  [facility] representative will transport patient at 11:00am.  Do you have the ability to pay for your medications: No.  Release of information consent forms completed and in the chart;  Patient's signature needed at discharge.  Patient to Follow up at: Follow-up Information    [facility] Follow up on 03/11/2018.   Specialty:  Behavioral Health Why:  Walk-in hours for medication management and therapy services are Monday-Friday from 8am-5pm. Please bring the following if you have them: Photo ID, social security card, any proof of income, and hospital discharge paperwork.  Contact [address] [place] [address] [phone]        [facility] Follow up.   Specialty:  Addiction Medicine Why:  Accepted for treatment on 03/04/18.  Contact information: [address] [place] [address] [phone]           Next level of care provider has access to [facility] Link:yes  Safety Planning and Suicide Prevention discussed: Yes,  with the patient     Has patient been referred to the Quitline?: Patient refused referral  Patient has been referred for addiction treatment: Yes  Maeola Sarah, LCSWA 03/04/2018, 9:26 AM

## 2018-03-09 ENCOUNTER — Emergency Department (HOSPITAL_COMMUNITY): Payer: Self-pay

## 2018-03-09 ENCOUNTER — Encounter (HOSPITAL_COMMUNITY): Payer: Self-pay | Admitting: Emergency Medicine

## 2018-03-09 ENCOUNTER — Emergency Department (HOSPITAL_COMMUNITY)
Admission: EM | Admit: 2018-03-09 | Discharge: 2018-03-11 | Disposition: A | Discharge status: Home / Self Care | Payer: Medicaid Other | Attending: Emergency Medicine | Admitting: Emergency Medicine

## 2018-03-09 DIAGNOSIS — W010XXA Fall on same level from slipping, tripping and stumbling without subsequent striking against object, initial encounter: Secondary | ICD-10-CM | POA: Insufficient documentation

## 2018-03-09 DIAGNOSIS — Z89431 Acquired absence of right foot: Secondary | ICD-10-CM | POA: Insufficient documentation

## 2018-03-09 DIAGNOSIS — F1910 Other psychoactive substance abuse, uncomplicated: Secondary | ICD-10-CM

## 2018-03-09 DIAGNOSIS — R45851 Suicidal ideations: Secondary | ICD-10-CM | POA: Insufficient documentation

## 2018-03-09 DIAGNOSIS — Z79899 Other long term (current) drug therapy: Secondary | ICD-10-CM | POA: Insufficient documentation

## 2018-03-09 DIAGNOSIS — I10 Essential (primary) hypertension: Secondary | ICD-10-CM | POA: Insufficient documentation

## 2018-03-09 DIAGNOSIS — S80811A Abrasion, right lower leg, initial encounter: Secondary | ICD-10-CM | POA: Insufficient documentation

## 2018-03-09 DIAGNOSIS — Z590 Homelessness: Secondary | ICD-10-CM | POA: Insufficient documentation

## 2018-03-09 DIAGNOSIS — Z046 Encounter for general psychiatric examination, requested by authority: Secondary | ICD-10-CM | POA: Insufficient documentation

## 2018-03-09 DIAGNOSIS — M79604 Pain in right leg: Secondary | ICD-10-CM

## 2018-03-09 DIAGNOSIS — Y9389 Activity, other specified: Secondary | ICD-10-CM | POA: Insufficient documentation

## 2018-03-09 DIAGNOSIS — Y999 Unspecified external cause status: Secondary | ICD-10-CM | POA: Insufficient documentation

## 2018-03-09 DIAGNOSIS — F17210 Nicotine dependence, cigarettes, uncomplicated: Secondary | ICD-10-CM | POA: Insufficient documentation

## 2018-03-09 DIAGNOSIS — Y929 Unspecified place or not applicable: Secondary | ICD-10-CM | POA: Insufficient documentation

## 2018-03-09 DIAGNOSIS — F1994 Other psychoactive substance use, unspecified with psychoactive substance-induced mood disorder: Secondary | ICD-10-CM | POA: Diagnosis present

## 2018-03-09 DIAGNOSIS — F333 Major depressive disorder, recurrent, severe with psychotic symptoms: Secondary | ICD-10-CM | POA: Insufficient documentation

## 2018-03-09 DIAGNOSIS — F339 Major depressive disorder, recurrent, unspecified: Secondary | ICD-10-CM

## 2018-03-09 LAB — RAPID URINE DRUG SCREEN, HOSP PERFORMED
Amphetamines: POSITIVE — AB
Barbiturates: NOT DETECTED
Benzodiazepines: NOT DETECTED
Cocaine: NOT DETECTED
Opiates: NOT DETECTED
Tetrahydrocannabinol: NOT DETECTED

## 2018-03-09 LAB — BASIC METABOLIC PANEL
ANION GAP: 9 (ref 5–15)
BUN: 25 mg/dL — AB (ref 6–20)
CALCIUM: 9.3 mg/dL (ref 8.9–10.3)
CO2: 27 mmol/L (ref 22–32)
CREATININE: 1 mg/dL (ref 0.61–1.24)
Chloride: 103 mmol/L (ref 98–111)
GFR calc Af Amer: >60 mL/min (ref >60)
GLUCOSE: 97 mg/dL (ref 70–99)
Potassium: 3.7 mmol/L (ref 3.5–5.1)
Sodium: 139 mmol/L (ref 135–145)

## 2018-03-09 LAB — CBC
HCT: 39.5 % (ref 39.0–52.0)
Hemoglobin: 14.5 g/dL (ref 13.0–17.0)
MCH: 32.1 pg (ref 26.0–34.0)
MCHC: 36.7 g/dL — AB (ref 30.0–36.0)
MCV: 87.4 fL (ref 78.0–100.0)
PLATELETS: 258 10*3/uL (ref 150–400)
RBC: 4.52 MIL/uL (ref 4.22–5.81)
RDW: 13 % (ref 11.5–15.5)
WBC: 10.4 10*3/uL (ref 4.0–10.5)

## 2018-03-09 LAB — ETHANOL: Alcohol, Ethyl (B): <10 mg/dL (ref <10)

## 2018-03-09 MED ORDER — TRAZODONE HCL 50 MG PO TABS
50.0000 mg | ORAL_TABLET | Freq: Every evening | ORAL | Status: DC | PRN
  Administered 2018-03-10: 50 mg via ORAL
  Filled 2018-03-09: qty 1

## 2018-03-09 MED ORDER — LORAZEPAM 2 MG/ML IJ SOLN
1.0000 mg | Freq: Once | INTRAMUSCULAR | Status: AC
  Administered 2018-03-09: 1 mg via INTRAMUSCULAR
  Filled 2018-03-09: qty 1

## 2018-03-09 MED ORDER — ACETAMINOPHEN 325 MG PO TABS
650.0000 mg | ORAL_TABLET | Freq: Once | ORAL | Status: AC
  Administered 2018-03-09: 650 mg via ORAL
  Filled 2018-03-09: qty 2

## 2018-03-09 MED ORDER — HYDROXYZINE HCL 25 MG PO TABS: 25.0000 mg | ORAL_TABLET | Freq: Four times a day (QID) | ORAL | Status: DC | PRN

## 2018-03-09 MED ORDER — NICOTINE 14 MG/24HR TD PT24
14.0000 mg | MEDICATED_PATCH | Freq: Every day | TRANSDERMAL | Status: DC
  Administered 2018-03-09 – 2018-03-11 (×3): 14 mg via TRANSDERMAL
  Filled 2018-03-09 (×3): qty 1

## 2018-03-09 MED ORDER — MENTHOL 3 MG MT LOZG
1.0000 | LOZENGE | OROMUCOSAL | Status: DC | PRN
  Administered 2018-03-09: 3 mg via ORAL
  Filled 2018-03-09: qty 9

## 2018-03-09 MED ORDER — IBUPROFEN 200 MG PO TABS
600.0000 mg | ORAL_TABLET | Freq: Once | ORAL | Status: AC
  Administered 2018-03-09: 600 mg via ORAL
  Filled 2018-03-09: qty 3

## 2018-03-09 MED ORDER — IBUPROFEN 200 MG PO TABS: 600.0000 mg | ORAL_TABLET | Freq: Three times a day (TID) | ORAL | Status: DC | PRN

## 2018-03-09 MED ORDER — DULOXETINE HCL 30 MG PO CPEP
30.0000 mg | ORAL_CAPSULE | Freq: Every day | ORAL | Status: DC
  Administered 2018-03-09 – 2018-03-11 (×3): 30 mg via ORAL
  Filled 2018-03-09 (×3): qty 1

## 2018-03-09 MED ORDER — CLONIDINE HCL 0.1 MG PO TABS
0.1000 mg | ORAL_TABLET | Freq: Every day | ORAL | Status: DC
  Administered 2018-03-10 – 2018-03-11 (×2): 0.1 mg via ORAL
  Filled 2018-03-09 (×2): qty 1

## 2018-03-09 MED ORDER — ACETAMINOPHEN 325 MG PO TABS: 650.0000 mg | ORAL_TABLET | Freq: Four times a day (QID) | ORAL | Status: DC | PRN
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
## 2018-03-09 NOTE — Discharge Instructions (Addendum)
For your mental health needs, you are advised to follow up with [facility].  New and returning patients are seen at their walk-in clinic.  Walk-in hours are Monday - Friday from 8:00 am - 3:00 pm.  Walk-in patients are seen on a first come, first served basis.  Try to arrive as early as possible for he best chance of being seen the same day:       [facility]      [address]      [place], [place] [address]      [phone]  To help you maintain a sober lifestyle, a substance abuse treatment program may be beneficial to you.  Contact one of the following facilities at your earliest opportunity to ask about enrolling:  RESIDENTIAL PROGRAMS:       [facility]      [address] [place], [place] [address]      [phone]       [facility]      [address]      [place], [place] [address]      [phone]  OUTPATIENT PROGRAMS:       [facility]      [address], [place] [address]      [phone]      New patients are seen at the walk-in clinic every Tuesday from 9:00 am - 12:00 pm

## 2018-03-09 NOTE — Patient Outreach (Signed)
CPSS John and I met with Pt and was able to process with Pt about how he is doing and to monitor services. CPSS was able to discuss what services Pt was seeking and to gain information to better assist Pt. CPSS was made aware of the services that he tried to receive previously. CPSS was able to get a consent form signed, so CPSS can fax information to [facility] for treatment.

## 2018-03-09 NOTE — ED Notes (Signed)
Bed: WLPT2 Expected date:  Expected time:  Means of arrival:  Comments: 

## 2018-03-09 NOTE — ED Notes (Signed)
Gave report to Vivi Ferns, RN for [employer] [facility]. Informed patient he will be transported to TCU. Provided patient a cup of ice water.

## 2018-03-09 NOTE — BH Assessment (Signed)
[facility] Assessment Progress Note     Per Malachy Chamber, NP, patient will need to be monitored overnight for safety and withdrawal potential and reassess in the morning.

## 2018-03-09 NOTE — BH Assessment (Deleted)
[facility] Assessment Progress Note    Per Malachy Chamber, NP, patient will need to be monitored overnight for safety and withdrawal potential

## 2018-03-09 NOTE — BH Assessment (Signed)
[facility] Assessment Progress Note      Informed Dr. Estell Harpin of disposition
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
## 2018-03-09 NOTE — ED Notes (Signed)
Patient stated if was discharged, he would put a cord around his neck to kill himself. Patient appears anxious and moving all round the stretcher. He will not sit still.  Dr. Haywood Lasso was made aware of patients response. He reported he would come speak with the patient. Informed patient of this this information. Provided patient another Sprite.

## 2018-03-10 ENCOUNTER — Other Ambulatory Visit: Payer: Self-pay

## 2018-03-10 DIAGNOSIS — F339 Major depressive disorder, recurrent, unspecified: Secondary | ICD-10-CM | POA: Insufficient documentation

## 2018-03-10 DIAGNOSIS — F1523 Other stimulant dependence with withdrawal: Secondary | ICD-10-CM | POA: Diagnosis not present

## 2018-03-10 DIAGNOSIS — F17210 Nicotine dependence, cigarettes, uncomplicated: Secondary | ICD-10-CM | POA: Diagnosis not present

## 2018-03-10 DIAGNOSIS — F1123 Opioid dependence with withdrawal: Secondary | ICD-10-CM

## 2018-03-10 DIAGNOSIS — F1910 Other psychoactive substance abuse, uncomplicated: Secondary | ICD-10-CM | POA: Insufficient documentation

## 2018-03-10 DIAGNOSIS — F1994 Other psychoactive substance use, unspecified with psychoactive substance-induced mood disorder: Secondary | ICD-10-CM | POA: Diagnosis not present

## 2018-03-10 MED ORDER — METHOCARBAMOL 500 MG PO TABS
500.0000 mg | ORAL_TABLET | Freq: Three times a day (TID) | ORAL | Status: DC | PRN
  Administered 2018-03-10: 500 mg via ORAL
  Filled 2018-03-10: qty 1

## 2018-03-10 MED ORDER — IBUPROFEN 200 MG PO TABS: 600.0000 mg | ORAL_TABLET | Freq: Three times a day (TID) | ORAL | Status: DC | PRN

## 2018-03-10 MED ORDER — NAPROXEN 500 MG PO TABS
500.0000 mg | ORAL_TABLET | Freq: Two times a day (BID) | ORAL | Status: DC | PRN
  Administered 2018-03-10: 500 mg via ORAL
  Filled 2018-03-10: qty 1

## 2018-03-10 MED ORDER — ONDANSETRON 4 MG PO TBDP: 4.0000 mg | ORAL_TABLET | Freq: Four times a day (QID) | ORAL | Status: DC | PRN

## 2018-03-10 MED ORDER — ARIPIPRAZOLE 2 MG PO TABS
2.0000 mg | ORAL_TABLET | Freq: Every day | ORAL | Status: DC
  Administered 2018-03-10 – 2018-03-11 (×2): 2 mg via ORAL
  Filled 2018-03-10 (×2): qty 1

## 2018-03-10 MED ORDER — CLONIDINE HCL 0.1 MG PO TABS: 0.1000 mg | ORAL_TABLET | Freq: Every day | ORAL | Status: DC

## 2018-03-10 MED ORDER — HYDROXYZINE HCL 25 MG PO TABS
25.0000 mg | ORAL_TABLET | Freq: Four times a day (QID) | ORAL | Status: DC | PRN
  Administered 2018-03-10: 25 mg via ORAL
  Filled 2018-03-10: qty 1

## 2018-03-10 MED ORDER — LOPERAMIDE HCL 2 MG PO CAPS: 2.0000 mg | ORAL_CAPSULE | ORAL | Status: DC | PRN

## 2018-03-10 MED ORDER — CLONIDINE HCL 0.1 MG PO TABS: 0.1000 mg | ORAL_TABLET | ORAL | Status: DC

## 2018-03-10 MED ORDER — DICYCLOMINE HCL 20 MG PO TABS
20.0000 mg | ORAL_TABLET | Freq: Four times a day (QID) | ORAL | Status: DC | PRN
  Administered 2018-03-10: 20 mg via ORAL
  Filled 2018-03-10: qty 1

## 2018-03-10 MED ORDER — CLONIDINE HCL 0.1 MG PO TABS
0.1000 mg | ORAL_TABLET | Freq: Four times a day (QID) | ORAL | Status: DC
  Administered 2018-03-10 – 2018-03-11 (×3): 0.1 mg via ORAL
  Filled 2018-03-10 (×3): qty 1

## 2018-03-10 NOTE — ED Notes (Signed)
[facility] ([facility]) REQUESTING DELAY ON DISCHARGE PENDING ADMISSION TO INPATIENT PLACEMENT FOR DETOX.

## 2018-03-10 NOTE — Consult Note (Addendum)
[facility] Psych ED Discharge  03/10/2018 12:57 PM Dwayne Alvarez  MRN:  [id number] Principal Problem: Substance induced mood disorder [facility]) Discharge Diagnoses:  Patient Active Problem List   Diagnosis Date Noted  . Episode of recurrent major depressive disorder (HCC) [F33.9]   . Polysubstance abuse (HCC) [F19.10]   . Severe recurrent major depression w/psychotic features, mood-congruent (HCC) [F33.3] 02/28/2018  . Substance induced mood disorder (HCC) [F19.94] 12/30/2017  . MDD (major depressive disorder), recurrent severe, without psychosis (HCC) [F33.2] 12/30/2017  . Suicidal ideation [R45.851]   . 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 Cocaine use disorder, mild, abuse (HCC) [F14.10]   . Polysubstance dependence including opioid type drug, episodic abuse (HCC) [F11.20, F19.20]     Subjective: Pt was seen and chart reviewed with treatment team and Dr Sharma Covert. Pt denies suicidal/homicidal ideation, denies auditory/visual hallucinations and does not appear to be responding to internal stimuli. Pt stated he was at [facility] and discharged 5 days ago and was sent to [facility] for substance abuse treatment, arranged by [facility]. When he arrived at [facility] they turned him away due to he has a partial right foot amputation and the reasoning was that he had to be able to go up and down stairs. He stated "they would not even let me show them I could do it." He then stated " I went straight out and started using meth again and now I am back here. Pt is looking for rehab for his drug abuse problem and is being helped by [facility] again today. Pt's UDS was postive for amphetamines and BAL negative. Pt's remaining lab work is unremarkable. X-ray of right ankle and right tibia/fibula due to a fall prior to admission are clear. Pt is stable and psychiatrically clear for discharge.   Total Time spent with patient: 45 minutes  Past Psychiatric History: As above  Past Medical History:  Past Medical History:   Diagnosis Date  . Hypertension    Past Surgical History:  Procedure Laterality Date  . FOOT AMPUTATION THROUGH METATARSAL Right    Family History: No family history on file. Family Psychiatric  History: Unknown Social History:  Social History   Substance and Sexual Activity  Alcohol Use Not Currently    Social History   Substance and Sexual Activity  Drug Use Yes  . Types: Methamphetamines, Cocaine, Oxycodone   Comment: heroin   Social History   Socioeconomic History  . Marital status: Single    Spouse name: Not on file  . Number of children: Not on file  . Years of education: Not on file  . Highest education level: Not on file  Occupational History  . Not on file  Social Needs  . Financial resource strain: Not on file  . Food insecurity:    Worry: Not on file    Inability: Not on file  . Transportation needs:    Medical: Not on file    Non-medical: Not on file  Tobacco Use  . Smoking status: Current Every Day Smoker    Packs/day: 0.50    Years: 30.00    Pack years: 15.00    Types: Cigarettes  . Smokeless tobacco: Never Used  Substance and Sexual Activity  . Alcohol use: Not Currently  . Drug use: Yes    Types: Methamphetamines, Cocaine, Oxycodone    Comment: heroin  . Sexual activity: Not Currently  Lifestyle  . Physical activity:    Days per week: Not on file    Minutes per session:  Not on file  . Stress: Not on file  Relationships  . Social connections:    Talks on phone: Not on file    Gets together: Not on file    Attends religious service: Not on file    Active member of club or organization: Not on file    Attends meetings of clubs or organizations: Not on file    Relationship status: Not on file  Other Topics Concern  . Not on file  Social History Narrative  . Not on file    Has this patient used any form of tobacco in the last 30 days? (Cigarettes, Smokeless Tobacco, Cigars, and/or Pipes) Prescription not provided because: Pt  declined  Current Medications: Current Facility-Administered Medications  Medication Dose Route Frequency Provider Last Rate Last Dose  . acetaminophen (TYLENOL) tablet 650 mg  650 mg Oral Q6H PRN Azalia Bilis, MD      . cloNIDine (CATAPRES) tablet 0.1 mg  0.1 mg Oral QAC breakfast Azalia Bilis, MD   0.1 mg at 03/10/18 1610  . DULoxetine (CYMBALTA) DR capsule 30 mg  30 mg Oral Daily Azalia Bilis, MD   30 mg at 03/10/18 1012  . hydrOXYzine (ATARAX/VISTARIL) tablet 25 mg  25 mg Oral Q6H PRN Azalia Bilis, MD      . ibuprofen (ADVIL,MOTRIN) tablet 600 mg  600 mg Oral Q8H PRN Azalia Bilis, MD      . menthol-cetylpyridinium (CEPACOL) lozenge 3 mg  1 lozenge Oral PRN Bethann Berkshire, MD   3 mg at 03/09/18 1839  . nicotine (NICODERM CQ - dosed in mg/24 hours) patch 14 mg  14 mg Transdermal Daily Azalia Bilis, MD   14 mg at 03/10/18 1014  . traZODone (DESYREL) tablet 50 mg  50 mg Oral QHS PRN Azalia Bilis, MD       Current Outpatient Medications  Medication Sig Dispense Refill  . cloNIDine (CATAPRES) 0.1 MG tablet Take 1 tablet (0.1 mg total) by mouth daily before breakfast. For high blood pressure 15 tablet 0  . DULoxetine (CYMBALTA) 30 MG capsule Take 1 capsule (30 mg total) by mouth daily. For depression 30 capsule 0  . hydrOXYzine (ATARAX/VISTARIL) 25 MG tablet Take 1 tablet (25 mg total) by mouth every 6 (six) hours as needed for anxiety. 30 tablet 0  . nicotine (NICODERM CQ - DOSED IN MG/24 HOURS) 14 mg/24hr patch Place 1 patch (14 mg total) onto the skin daily. (may buy from over the counter): For smoking cessation 28 patch 0  . traZODone (DESYREL) 50 MG tablet Take 1 tablet (50 mg total) by mouth at bedtime as needed for sleep. 30 tablet 0    Musculoskeletal: Strength & Muscle Tone: within normal limits Gait & Station: normal Patient leans: N/A  Psychiatric Specialty Exam: Physical Exam  Nursing note and vitals reviewed. Constitutional: He is oriented to person, place, and time.  He appears well-developed and well-nourished.  HENT:  Head: Normocephalic and atraumatic.  Neck: Normal range of motion.  Respiratory: Effort normal.  Musculoskeletal: Normal range of motion.  Neurological: He is alert and oriented to person, place, and time.  Psychiatric: His speech is normal and behavior is normal. Judgment and thought content normal. Cognition and memory are normal. He exhibits a depressed mood.    Review of Systems  Psychiatric/Behavioral: Positive for depression and substance abuse. Negative for hallucinations, memory loss and suicidal ideas. The patient is nervous/anxious. The patient does not have insomnia.   All other systems reviewed and are negative.  Blood pressure 125/76, pulse 74, temperature 98.3 F (36.8 C), temperature source Oral, resp. rate 19, SpO2 99 %.There is no height or weight on file to calculate BMI.  General Appearance: Casual  Eye Contact:  Good  Speech:  Clear and Coherent  Volume:  Normal  Mood:  Anxious, Depressed and Irritable  Affect:  Congruent, Depressed and Flat  Thought Process:  Coherent, Goal Directed and Linear  Orientation:  Full (Time, Place, and Person)  Thought Content:  Logical  Suicidal Thoughts:  No  Homicidal Thoughts:  No  Memory:  Immediate;   Good Recent;   Good Remote;   Fair  Judgement:  Intact  Insight:  Fair and Lacking  Psychomotor Activity:  Normal  Concentration:  Concentration: Good and Attention Span: Good  Recall:  Good  Fund of Knowledge:  Good  Language:  Good  Akathisia:  No  Handed:  Right  AIMS (if indicated):   N/A  Assets:  Communication Skills Desire for Improvement Financial Resources/Insurance Housing  ADL's:  Intact  Cognition:  WNL  Sleep:   N/A     Demographic Factors:  Male  Loss Factors: Financial problems/change in socioeconomic status  Historical Factors: Impulsivity  Risk Reduction Factors:   Sense of responsibility to family  Continued Clinical Symptoms:   Depression:   Impulsivity Alcohol/Substance Abuse/Dependencies  Cognitive Features That Contribute To Risk:  Closed-mindedness    Suicide Risk:  Minimal: No identifiable suicidal ideation.  Patients presenting with no risk factors but with morbid ruminations; may be classified as minimal risk based on the severity of the depressive symptoms    Plan Of Care/Follow-up recommendations:  Activity:  as tolerated Diet:  Heart healthy  Disposition: Take all medications as prescribed. Keep all follow-up appointments as scheduled.  Do not consume alcohol or use illegal drugs while on prescription medications. Report any adverse effects from your medications to your primary care provider promptly.  In the event of recurrent symptoms or worsening symptoms, call 911, a crisis hotline, or go to the nearest emergency department for evaluation.   Laveda Abbe, NP 03/10/2018, 12:57 PM   Patient seen face-to-face for psychiatric evaluation, chart reviewed and case discussed with the physician extender and developed treatment plan. Reviewed the information documented and agree with the treatment plan.  Juanetta Beets, DO 03/10/18 10:36 PM

## 2018-03-10 NOTE — ED Notes (Signed)
SNACK GIVEN

## 2018-03-10 NOTE — ED Notes (Signed)
PT REMAINS IN TCU- PT HAS HAD NO PROBLEMS WALKING IN HIS ROOM OR FROM STRETCHER TO BATHROOM IN HIS CURRENT ROOM  ALL DAY WITNESSED BY THIS WRITER AND SITTER BONNIE NT. INFORMED PT HE WOULD BE MOVING TO [facility] AND BATHROOM WAS NOT IN EACH ROOM. PT REPORTS HE WOULD HAVE DIFFICULTY MEETING THE REQUIREMENTS FOR THIS AREA.   PT REPORTS HE WILL BE UNABLE TO WALK THE LONG DISTANCES IN OTHER AREA. PT HAS HAD AN AMPUTATION TO RT FOOT WITH NO PROSTHESES TO MAINTAIN STABLE GAIT. PT REPORTS HE WEARS ONLY HIS SHOE AS HIS PROSTHETIC.  [facility] DIANE RN MADE AWARE

## 2018-03-10 NOTE — ED Notes (Signed)
TTS AT BEDSIDE 

## 2018-03-10 NOTE — ED Notes (Addendum)
Pt stated "I still hear voices.  They're just not as loud.  I was staying in [facility] but I left to get high and now I have no place to go.  The voices are telling me to kill myself.  I would OD on heroin."

## 2018-03-10 NOTE — ED Notes (Signed)
[facility] WAITING ON BED AVAILABILITY FOR PT

## 2018-03-10 NOTE — Progress Notes (Addendum)
Patient ID: Dwayne Alvarez, male   DOB: [birth year], 52 y.o.   MRN: [id number]  Pt endorsing suicidal ideation with a plan to hang himself. Pt is in withdrawal from methamphetamine and heroin. Pt will benefit for 24 hour observation for safety and medication efficacy. Medications adjusted.   Laveda Abbe, NP-C 03-10-2018     1723  Patient's chart reviewed and case discussed with the physician extender and developed treatment plan. Reviewed the information documented and agree with the treatment plan.  Juanetta Beets, DO 03/10/18 11:04 PM

## 2018-03-10 NOTE — BH Assessment (Signed)
[facility] Assessment Progress Note  Per [name], DO, this pt does not require psychiatric hospitalization at this time.  Pt is to be discharged from [facility] with referral information for area substance abuse treatment providers.  This has been included in pt's discharge instructions.  Pt would also benefit from seeing [facility]; they will be asked to speak to pt.  Pt's nurse, Ashley, has been notified.  Ava Tangney, MA Triage Specialist [phone]     

## 2018-03-10 NOTE — Discharge Instructions (Signed)
To help you maintain a sober lifestyle, a substance abuse treatment program may be beneficial to you.  Contact one of the following facilities at your earliest opportunity to ask about enrolling:  RESIDENTIAL PROGRAMS:       [facility]      [address] [place], [place] [address]      [phone]       [facility]      [address]      [place], [place] [address]      [phone]  OUTPATIENT PROGRAMS:       [facility]      [address], [place] [address]      [phone]      New patients are seen at the walk-in clinic every Tuesday from 9:00 am - 12:00 pm

## 2018-03-11 DIAGNOSIS — F1994 Other psychoactive substance use, unspecified with psychoactive substance-induced mood disorder: Secondary | ICD-10-CM

## 2018-03-11 DIAGNOSIS — F17210 Nicotine dependence, cigarettes, uncomplicated: Secondary | ICD-10-CM

## 2018-03-11 MED ORDER — DULOXETINE HCL 30 MG PO CPEP: 30.0000 mg | ORAL_CAPSULE | Freq: Every day | ORAL | 0 refills | Status: AC

## 2018-03-11 MED ORDER — NICOTINE 14 MG/24HR TD PT24: 14.0000 mg | MEDICATED_PATCH | Freq: Every day | TRANSDERMAL | 0 refills | Status: AC

## 2018-03-11 MED ORDER — ARIPIPRAZOLE 2 MG PO TABS: 2.0000 mg | ORAL_TABLET | Freq: Every day | ORAL | 0 refills | Status: AC

## 2018-03-11 NOTE — Consult Note (Addendum)
[facility] Discharge  03/11/2018 12:31 PM Dwayne PolingClint Alvarez  MRN:  [id number] Principal Problem: Substance induced mood disorder [facility](HCC) Discharge Diagnoses:  Patient Active Problem List   Diagnosis Date Noted  . Episode of recurrent major depressive disorder (HCC) [F33.9]   . Polysubstance abuse (HCC) [F19.10]   . Severe recurrent major depression w/psychotic features, mood-congruent (HCC) [F33.3] 02/28/2018  . Substance induced mood disorder (HCC) [F19.94] 12/30/2017  . MDD (major depressive disorder), recurrent severe, without psychosis (HCC) [F33.2] 12/30/2017  . Suicidal ideation [R45.851]   . 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 Cocaine use disorder, mild, abuse (HCC) [F14.10]   . Polysubstance dependence including opioid type drug, episodic abuse (HCC) [F11.20, F19.20]     Subjective: Pt was seen and chart reviewed with treatment team and Dr Sharma CovertNorman. Pt denies suicidal/homicidal ideation, denies auditory/visual hallucinations and does not appear to be responding to internal stimuli. [facility] and [facility] and [facility], arranged by [facility]. When [facility] they turned him away due to he has a partial right foot amputation and the reasoning was that he had to be able to go up and down stairs. He stated "they would not even let me show them I could do it." He then stated " I went straight out and started using meth again and now I am back here. Pt is looking for rehab for his drug abuse problem and is being helped by [facility] again today. Pt's UDS was postive for amphetamines and BAL negative. Pt's remaining lab work is unremarkable. X-ray of right ankle and right tibia/fibula due to a fall prior to admission are clear. Peer Support continues to seek a rehab treatment bed so Pt can receive substance abuse help in the community. He reports feeling better today. Pt is stable and psychiatrically clear for  discharge.   Total Time spent with patient: 45 minutes  Past Psychiatric History: As above  Past Medical History:  Past Medical History:  Diagnosis Date  . Hypertension     Past Surgical History:  Procedure Laterality Date  . FOOT AMPUTATION THROUGH METATARSAL Right    Family History: No family history on file. Family Psychiatric  History: Unknown Social History:  Social History   Substance and Sexual Activity  Alcohol Use Not Currently     Social History   Substance and Sexual Activity  Drug Use Yes  . Types: Methamphetamines, Cocaine, Oxycodone   Comment: heroin    Social History   Socioeconomic History  . Marital status: Single    Spouse name: Not on file  . Number of children: Not on file  . Years of education: Not on file  . Highest education level: Not on file  Occupational History  . Not on file  Social Needs  . Financial resource strain: Not on file  . Food insecurity:    Worry: Not on file    Inability: Not on file  . Transportation needs:    Medical: Not on file    Non-medical: Not on file  Tobacco Use  . Smoking status: Current Every Day Smoker    Packs/day: 0.50    Years: 30.00    Pack years: 15.00    Types: Cigarettes  . Smokeless tobacco: Never Used  Substance and Sexual Activity  . Alcohol use: Not Currently  . Drug use: Yes    Types: Methamphetamines, Cocaine, Oxycodone    Comment: heroin  .  Sexual activity: Not Currently  Lifestyle  . Physical activity:    Days per week: Not on file    Minutes per session: Not on file  . Stress: Not on file  Relationships  . Social connections:    Talks on phone: Not on file    Gets together: Not on file    Attends religious service: Not on file    Active member of club or organization: Not on file    Attends meetings of clubs or organizations: Not on file    Relationship status: Not on file  Other Topics Concern  . Not on file  Social History Narrative  . Not on file    Has this patient  used any form of tobacco in the last 30 days? (Cigarettes, Smokeless Tobacco, Cigars, and/or Pipes) Prescription not provided because: Pt declined  Current Medications: Current Facility-Administered Medications  Medication Dose Route Frequency Provider Last Rate Last Dose  . acetaminophen (TYLENOL) tablet 650 mg  650 mg Oral Q6H PRN Azalia Bilis, MD      . ARIPiprazole (ABILIFY) tablet 2 mg  2 mg Oral Daily Laveda Abbe, NP   2 mg at 03/11/18 1052  . cloNIDine (CATAPRES) tablet 0.1 mg  0.1 mg Oral QAC breakfast Azalia Bilis, MD   0.1 mg at 03/11/18 1030  . cloNIDine (CATAPRES) tablet 0.1 mg  0.1 mg Oral QID Laveda Abbe, NP   0.1 mg at 03/11/18 1052   Followed by  . [START ON 03/13/2018] cloNIDine (CATAPRES) tablet 0.1 mg  0.1 mg Oral BH-qamhs Laveda Abbe, NP       Followed by  . [START ON 03/15/2018] cloNIDine (CATAPRES) tablet 0.1 mg  0.1 mg Oral QAC breakfast Laveda Abbe, NP      . dicyclomine (BENTYL) tablet 20 mg  20 mg Oral Q6H PRN Laveda Abbe, NP   20 mg at 03/10/18 1753  . DULoxetine (CYMBALTA) DR capsule 30 mg  30 mg Oral Daily Azalia Bilis, MD   30 mg at 03/11/18 1051  . hydrOXYzine (ATARAX/VISTARIL) tablet 25 mg  25 mg Oral Q6H PRN Laveda Abbe, NP   25 mg at 03/10/18 1752  . ibuprofen (ADVIL,MOTRIN) tablet 600 mg  600 mg Oral Q8H PRN Otho Bellows, RPH      . loperamide (IMODIUM) capsule 2-4 mg  2-4 mg Oral PRN Laveda Abbe, NP      . menthol-cetylpyridinium (CEPACOL) lozenge 3 mg  1 lozenge Oral PRN Bethann Berkshire, MD   3 mg at 03/09/18 1839  . methocarbamol (ROBAXIN) tablet 500 mg  500 mg Oral Q8H PRN Laveda Abbe, NP   500 mg at 03/10/18 1753  . naproxen (NAPROSYN) tablet 500 mg  500 mg Oral BID PRN Laveda Abbe, NP   500 mg at 03/10/18 1753  . nicotine (NICODERM CQ - dosed in mg/24 hours) patch 14 mg  14 mg Transdermal Daily Azalia Bilis, MD   14 mg at 03/11/18 1053  . ondansetron (ZOFRAN-ODT)  disintegrating tablet 4 mg  4 mg Oral Q6H PRN Laveda Abbe, NP      . traZODone (DESYREL) tablet 50 mg  50 mg Oral QHS PRN Azalia Bilis, MD   50 mg at 03/10/18 2152   Current Outpatient Medications  Medication Sig Dispense Refill  . cloNIDine (CATAPRES) 0.1 MG tablet Take 1 tablet (0.1 mg total) by mouth daily before breakfast. For high blood pressure 15 tablet 0  . DULoxetine (CYMBALTA)  30 MG capsule Take 1 capsule (30 mg total) by mouth daily. For depression 30 capsule 0  . hydrOXYzine (ATARAX/VISTARIL) 25 MG tablet Take 1 tablet (25 mg total) by mouth every 6 (six) hours as needed for anxiety. 30 tablet 0  . nicotine (NICODERM CQ - DOSED IN MG/24 HOURS) 14 mg/24hr patch Place 1 patch (14 mg total) onto the skin daily. (may buy from over the counter): For smoking cessation 28 patch 0  . traZODone (DESYREL) 50 MG tablet Take 1 tablet (50 mg total) by mouth at bedtime as needed for sleep. 30 tablet 0    Musculoskeletal: Strength & Muscle Tone: within normal limits Gait & Station: normal Patient leans: N/A  Psychiatric Specialty Exam: Physical Exam  Nursing note and vitals reviewed. Constitutional: He is oriented to person, place, and time. He appears well-developed and well-nourished.  HENT:  Head: Normocephalic and atraumatic.  Neck: Normal range of motion.  Respiratory: Effort normal.  Musculoskeletal: Normal range of motion.  Neurological: He is alert and oriented to person, place, and time.  Psychiatric: His speech is normal and behavior is normal. Judgment and thought content normal. Cognition and memory are normal. He exhibits a depressed mood.    Review of Systems  Psychiatric/Behavioral: Positive for depression and substance abuse. Negative for hallucinations, memory loss and suicidal ideas. The patient is nervous/anxious. The patient does not have insomnia.   All other systems reviewed and are negative.   Blood pressure 101/65, pulse (!) 53, temperature 97.8 F  (36.6 C), temperature source Oral, resp. rate 16, SpO2 97 %.There is no height or weight on file to calculate BMI.  General Appearance: Casual  Eye Contact:  Good  Speech:  Clear and Coherent  Volume:  Normal  Mood:  Anxious, Depressed and Irritable  Affect:  Congruent, Depressed and Flat  Thought Process:  Coherent, Goal Directed and Linear  Orientation:  Full (Time, Place, and Person)  Thought Content:  Logical  Suicidal Thoughts:  No  Homicidal Thoughts:  No  Memory:  Immediate;   Good Recent;   Good Remote;   Fair  Judgement:  Intact  Insight:  Fair and Lacking  Psychomotor Activity:  Normal  Concentration:  Concentration: Good and Attention Span: Good  Recall:  Good  Fund of Knowledge:  Good  Language:  Good  Akathisia:  No  Handed:  Right  AIMS (if indicated):   N/A  Assets:  Communication Skills Desire for Improvement Financial Resources/Insurance Housing  ADL's:  Intact  Cognition:  WNL  Sleep:   N/A     Demographic Factors:  Male  Loss Factors: Financial problems/change in socioeconomic status  Historical Factors: Impulsivity  Risk Reduction Factors:   Sense of responsibility to family  Continued Clinical Symptoms:  Depression:   Impulsivity Alcohol/Substance Abuse/Dependencies  Cognitive Features That Contribute To Risk:  Closed-mindedness    Suicide Risk:  Minimal: No identifiable suicidal ideation.  Patients presenting with no risk factors but with morbid ruminations; may be classified as minimal risk based on the severity of the depressive symptoms    Plan Of Care/Follow-up recommendations:  Activity:  as tolerated Diet:  Heart healthy  Disposition: Take all medications as prescribed. -Abilify 2 mg daily for mood control  Keep all follow-up appointments as scheduled.  Do not consume alcohol or use illegal drugs while on prescription medications. Report any adverse effects from your medications to your primary care provider promptly.   In the event of recurrent symptoms or worsening symptoms, call 911,  a crisis hotline, or go to the nearest emergency department for evaluation.   Laveda Abbe, NP 03/11/2018, 12:31 PM   Patient seen face-to-face for psychiatric evaluation, chart reviewed and case discussed with the physician extender and developed treatment plan. Reviewed the information documented and agree with the treatment plan.  Juanetta Beets, DO 03/11/18 11:17 PM

## 2018-03-11 NOTE — Patient Outreach (Signed)
[facility] has been working on referrals for detox and residential substance use treatment for the patient the past two days 03/10/18 and 03/11/18. [facility], [facility], and [facility]. [facility] tried an [facility] referral and [facility] admissions staff stated that they will not have any beds available today and might not have any till sometime next week. [facility] already provided the patient with several recovery resources including an NA meeting list, residential/outpatient substance use treatment center list, [facility] list, and [facility] contact information. [facility] strongly encouraged the patient to follow up with [facility] after [facility] for further recovery support and help with recovery resources.

## 2018-03-11 NOTE — BH Assessment (Signed)
[facility]  Per Juanetta BeetsJacqueline Norman, DO, this pt does not require psychiatric hospitalization at this time.  [facility].  He is also to be provided with information regarding area substance abuse treatment providers.  These have been included in pt's discharge instructions.  Pt would also benefit from seeing [facility]; they will be asked to speak to pt.  Pt's nurse, Ginger, has been notified.  Doylene Canninghomas Kamesha Herne, MA Triage Specialist [phone]

## 2018-03-11 NOTE — Progress Notes (Signed)
CSW consulted for assistance with shelter resources and transportation. CSW spoke with patient at bedside who stated that [facility] coming to the hospital. CSW informed that patient has been denied from multiple facilities referred to by [facility] due to his ambulatory issues. CSW called around to various homeless shelters in this area and surrounding areas. CSW spoke with a representative from [facility] who stated they may have a few beds available but that they do not start the screening process until 5pm. CSW presented this to patient and stated we could provide bus passes to get him there. Patient then requested that he be given a taxi voucher [place]. CSW aware patient walks with crutches, has had his foot amputated and has ambulatory issues. CSW to provide taxi voucher for patient to requested address once ready for discharge.  Archie BalboaMackenzie Irwin, LCSWA  Clinical Social Work Department  [facility]  [phone]

## 2018-03-12 ENCOUNTER — Encounter (HOSPITAL_COMMUNITY): Payer: Self-pay

## 2018-03-12 ENCOUNTER — Other Ambulatory Visit: Payer: Self-pay

## 2018-03-12 ENCOUNTER — Emergency Department (HOSPITAL_COMMUNITY)
Admission: EM | Admit: 2018-03-12 | Discharge: 2018-03-13 | Disposition: A | Discharge status: Home / Self Care | Payer: Self-pay | Attending: Emergency Medicine | Admitting: Emergency Medicine

## 2018-03-12 DIAGNOSIS — F339 Major depressive disorder, recurrent, unspecified: Secondary | ICD-10-CM | POA: Insufficient documentation

## 2018-03-12 DIAGNOSIS — F1910 Other psychoactive substance abuse, uncomplicated: Secondary | ICD-10-CM | POA: Insufficient documentation

## 2018-03-12 DIAGNOSIS — R45851 Suicidal ideations: Secondary | ICD-10-CM | POA: Insufficient documentation

## 2018-03-12 DIAGNOSIS — F17210 Nicotine dependence, cigarettes, uncomplicated: Secondary | ICD-10-CM | POA: Insufficient documentation

## 2018-03-12 LAB — CBC
HCT: 41.6 % (ref 39.0–52.0)
Hemoglobin: 14.6 g/dL (ref 13.0–17.0)
MCH: 31.5 pg (ref 26.0–34.0)
MCHC: 35.1 g/dL (ref 30.0–36.0)
MCV: 89.8 fL (ref 78.0–100.0)
PLATELETS: 265 10*3/uL (ref 150–400)
RBC: 4.63 MIL/uL (ref 4.22–5.81)
RDW: 13.2 % (ref 11.5–15.5)
WBC: 6 10*3/uL (ref 4.0–10.5)

## 2018-03-12 LAB — RAPID URINE DRUG SCREEN, HOSP PERFORMED
AMPHETAMINES: NOT DETECTED
Barbiturates: NOT DETECTED
Benzodiazepines: NOT DETECTED
Cocaine: NOT DETECTED
OPIATES: NOT DETECTED
Tetrahydrocannabinol: NOT DETECTED

## 2018-03-12 LAB — ACETAMINOPHEN LEVEL

## 2018-03-12 LAB — COMPREHENSIVE METABOLIC PANEL
ALT: 160 U/L — ABNORMAL HIGH (ref 0–44)
ANION GAP: 6 (ref 5–15)
AST: 197 U/L — AB (ref 15–41)
Albumin: 3.4 g/dL — ABNORMAL LOW (ref 3.5–5.0)
Alkaline Phosphatase: 51 U/L (ref 38–126)
BUN: 11 mg/dL (ref 6–20)
CHLORIDE: 104 mmol/L (ref 98–111)
CO2: 29 mmol/L (ref 22–32)
Calcium: 9.3 mg/dL (ref 8.9–10.3)
Creatinine, Ser: 0.79 mg/dL (ref 0.61–1.24)
GFR calc Af Amer: >60 mL/min (ref >60)
Glucose, Bld: 84 mg/dL (ref 70–99)
POTASSIUM: 4.2 mmol/L (ref 3.5–5.1)
Sodium: 139 mmol/L (ref 135–145)
Total Bilirubin: 0.7 mg/dL (ref 0.3–1.2)
Total Protein: 7.6 g/dL (ref 6.5–8.1)

## 2018-03-12 LAB — ETHANOL

## 2018-03-12 LAB — SALICYLATE LEVEL

## 2018-03-12 MED ORDER — DULOXETINE HCL 30 MG PO CPEP
30.0000 mg | ORAL_CAPSULE | Freq: Every day | ORAL | Status: DC
  Administered 2018-03-13: 30 mg via ORAL
  Filled 2018-03-12: qty 1

## 2018-03-12 MED ORDER — TRAZODONE HCL 50 MG PO TABS: 50.0000 mg | ORAL_TABLET | Freq: Every evening | ORAL | Status: DC | PRN

## 2018-03-12 MED ORDER — ARIPIPRAZOLE 2 MG PO TABS
2.0000 mg | ORAL_TABLET | Freq: Every day | ORAL | Status: DC
  Administered 2018-03-13: 2 mg via ORAL
  Filled 2018-03-12: qty 1

## 2018-03-12 MED ORDER — NICOTINE 14 MG/24HR TD PT24
14.0000 mg | MEDICATED_PATCH | Freq: Every day | TRANSDERMAL | Status: DC
  Administered 2018-03-13: 14 mg via TRANSDERMAL
  Filled 2018-03-12: qty 1

## 2018-03-12 MED ORDER — HYDROXYZINE HCL 25 MG PO TABS: 25.0000 mg | ORAL_TABLET | Freq: Four times a day (QID) | ORAL | Status: DC | PRN

## 2018-03-12 MED ORDER — CLONIDINE HCL 0.1 MG PO TABS
0.1000 mg | ORAL_TABLET | Freq: Every day | ORAL | Status: DC
  Administered 2018-03-13: 0.1 mg via ORAL
  Filled 2018-03-12: qty 1

## 2018-03-12 NOTE — ED Triage Notes (Addendum)
Pt sts he isnt SI right now, sometimes he gets depressed and wants to die, not right now. He [facility] and [facility]. He didn't go, and instead friends called [facility] who said he can have a been on Monday.  Pt is just seeking a bed to stay in for the weekend until Monday. Denies any needs except mild foot pain from a fall 2 weeks ago. AxOx3. Per Dr Adela LankFloyd no labs needed since pt was here yesterday. 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  Pt has minor scrapes to right shin from fall 2 days ago, no discharge noted.

## 2018-03-12 NOTE — ED Triage Notes (Signed)
Patient states he is suicidal and has a plan to OD. Patient denies HI. Patient states no alcohol or drug use x 4 days. Patient denies any auditory or visual hallucinations.

## 2018-03-12 NOTE — BH Assessment (Signed)
[facility] Assessment Progress Note  Case was staffed with lord DNP who recommended patient be monitored and observed for safety. Patient will be seen by psychiatry in the a.m.

## 2018-03-12 NOTE — ED Notes (Signed)
Bed: ON62WA28 Expected date:  Expected time:  Means of arrival:  Comments: Hold for triage 1

## 2018-03-12 NOTE — BH Assessment (Signed)
Assessment Note  Dwayne Alvarez is an 52 y.o. male that presents this date with S/I. Patient states at the time of assessment he has a plan to OD although is vague in reference to where/what medications he would use. Patient renders conflicting information and per notes denied S/I at 1616 hours. Per that note, "Patient states he isn't SI right now, sometimes he gets depressed and wants to die, not right now. He said he [facility] and was supposed to go to [facility]. He didn't go, and instead friends called [facility] who reports he can be admitted on Monday. Patient is just seeking a bed to stay in for the weekend until Monday". Patient has a history of polysubstance use and admits to using Heroin and Amphetamines earlier this date although per notes on admission denied any use in four days. Patient's BAL and UDS are pending. [facility] presenting at that time with similar symptoms. Per that note on 03/09/18, "Patient presented with pain issues and suicidal ideation. Patient states that he [facility] in [facility] or so [facility] and [facility]  However, he states that when he arrived there that he was denied admission because they did not think that he could manage the campus on crutches. He states that they discharged him and took him to the bus station and dropped him off. He states that they just left him there with his stuff and drove off. Patient states that he has relapsed since then and states that he has been using 1/2 gram daily and he has been injecting 1/4 gram of methamphetamine a couple times weekly". Patient states he has a two year old son and his mother has recently died reporting he wants to maintain his sobriety in order to get his son into his custody to keep him out of foster care. Patient was noted to be drowsy this date although oriented and cooperative. His speech was slurred, soft and slow. Patient states he continues to be  depressed with symptoms to include: feeling worthless and guilt. Patient did not appear to be responding to any internal stimuli. Patient denies any H/I or AVH this date. Case was staffed with lord DNP who recommended patient be monitored and observed for safety. Patient will be seen by psychiatry in the a.m.    Diagnosis: F33.2  MDD recurrent without psychotic features, severe, polysubstance use   Past Medical History:  Past Medical History:  Diagnosis Date  . Hypertension     Past Surgical History:  Procedure Laterality Date  . EYE SURGERY    . FOOT AMPUTATION THROUGH METATARSAL Right     Family History: History reviewed. No pertinent family history.  Social History:  reports that he has been smoking cigarettes. He has a 15.00 pack-year smoking history. He has never used smokeless tobacco. He reports that he drank alcohol. He reports that he has current or past drug history. Drugs: Methamphetamines, Cocaine, and Oxycodone.  Additional Social History:  Alcohol / Drug Use Pain Medications: see MAR Prescriptions: see Mar Over the Counter: see MAR History of alcohol / drug use?: Yes Longest period of sobriety (when/how long): no significant periods of abstinence Negative Consequences of Use: Financial, Personal relationships Withdrawal Symptoms: Agitation, Tremors, Sweats Substance #1 Name of Substance 1: Heroin 1 - Age of First Use: 16 1 - Amount (size/oz): 1/2 gram  1 - Frequency: Three to four times a week 1 - Duration: Patient states "  for years"  1 - Last Use / Amount: 03/12/18 unknown amount UDS pending Substance #2 Name of Substance 2: methamphetamine 2 - Age of First Use: 24 2 - Amount (size/oz): Pt reports "different amounts"  2 - Frequency: Pt states daily for the last week  2 - Duration: since onset 2 - Last Use / Amount: 03/12/18 Unknown amount UDS pending  CIWA: CIWA-Ar BP: (!) 124/96 Pulse Rate: 73 COWS:    Allergies: No Known Allergies  Home Medications:   (Not in a hospital admission)  OB/GYN Status:  No LMP for male patient.  General Assessment Data [facility] of Assessment: [facility] ED TTS Assessment: In system Is this a Tele or Face-to-Face Assessment?: Face-to-Face Is this an Initial Assessment or a Re-assessment for this encounter?: Initial Assessment Patient Accompanied by:: (NA) Language Other than English: No Living Arrangements: Homeless/Shelter What gender do you identify as?: Male Marital status: Married Jordan name: NA Pregnancy Status: No Living Arrangements: Alone Can pt return to current living arrangement?: Yes Admission Status: Voluntary Is patient capable of signing voluntary admission?: Yes Referral Source: Self/Family/Friend Insurance type: None  Medical Screening Exam [facility] Walk-in ONLY) Medical Exam completed: Yes  Crisis Care Plan Living Arrangements: Alone Legal Guardian: (Self) Name of Psychiatrist: None Name of Therapist: None  Education Status Is patient currently in school?: No Highest grade of school patient has completed: 11 Is the patient employed, unemployed or receiving disability?: Unemployed  Risk to self with the past 6 months Suicidal Ideation: Yes-Currently Present Has patient been a risk to self within the past 6 months prior to admission? : Yes Suicidal Intent: Yes-Currently Present Has patient had any suicidal intent within the past 6 months prior to admission? : Yes Is patient at risk for suicide?: Yes Suicidal Plan?: Yes-Currently Present Has patient had any suicidal plan within the past 6 months prior to admission? : Yes Specify Current Suicidal Plan: Plan to OD Access to Means: No Specify Access to Suicidal Means: NA What has been your use of drugs/alcohol within the last 12 months?: Daily Previous Attempts/Gestures: (Just thoughts per notes) How many times?: 0 Other Self Harm Risks: NA Triggers for Past Attempts: Unknown Intentional Self Injurious Behavior: None Family  Suicide History: No Recent stressful life event(s): (Excessive SA use) Persecutory voices/beliefs?: No Depression: Yes Depression Symptoms: Fatigue, Feeling worthless/self pity Substance abuse history and/or treatment for substance abuse?: Yes Suicide prevention information given to non-admitted patients: Not applicable  Risk to Others within the past 6 months Homicidal Ideation: No Does patient have any lifetime risk of violence toward others beyond the six months prior to admission? : No Thoughts of Harm to Others: No Current Homicidal Intent: No Current Homicidal Plan: No Access to Homicidal Means: No Identified Victim: NA History of harm to others?: No Assessment of Violence: None Noted Violent Behavior Description: NA Does patient have access to weapons?: No Criminal Charges Pending?: No Does patient have a court date: No Is patient on probation?: No  Psychosis Hallucinations: None noted Delusions: None noted  Mental Status Report Appearance/Hygiene: In scrubs Eye Contact: Poor Motor Activity: Freedom of movement Speech: Soft, Slow Level of Consciousness: Drowsy Mood: Depressed Affect: Sad, Flat Anxiety Level: Minimal Panic attack frequency: (NA) Most recent panic attack: (NA) Thought Processes: Coherent, Relevant Judgement: Impaired Orientation: Person, Place, Time Obsessive Compulsive Thoughts/Behaviors: Moderate  Cognitive Functioning Concentration: Decreased Memory: Recent Intact, Remote Intact Is patient IDD: No Insight: Poor Impulse Control: Poor Appetite: Fair Have you had any weight changes? : No Change  Sleep: No Change Total Hours of Sleep: 7 Vegetative Symptoms: Decreased grooming  ADLScreening [facility] Assessment Services) Patient's cognitive ability adequate to safely complete daily activities?: Yes Patient able to express need for assistance with ADLs?: Yes Independently performs ADLs?: Yes (appropriate for developmental age)  Prior Inpatient  Therapy Prior Inpatient Therapy: Yes Prior Therapy Dates: 2019 Prior Therapy Facilty/Provider([facility]): [facility] Reason for Treatment: MH issues  Prior Outpatient Therapy Prior Outpatient Therapy: No Does patient have an ACCT team?: No Does patient have Intensive In-House Services?  : No Does patient have [facility] services? : No Does patient have P4CC services?: No  ADL Screening (condition at time of admission) Patient's cognitive ability adequate to safely complete daily activities?: Yes Is the patient deaf or have difficulty hearing?: No Does the patient have difficulty seeing, even when wearing glasses/contacts?: No Does the patient have difficulty concentrating, remembering, or making decisions?: No Patient able to express need for assistance with ADLs?: Yes Does the patient have difficulty dressing or bathing?: No Independently performs ADLs?: Yes (appropriate for developmental age) Does the patient have difficulty walking or climbing stairs?: Yes Weakness of Legs: None Weakness of Arms/Hands: None  Home Assistive Devices/Equipment Home Assistive Devices/Equipment: Crutches  Therapy Consults (therapy consults require a physician order) PT Evaluation Needed: No OT Evalulation Needed: No SLP Evaluation Needed: No Abuse/Neglect Assessment (Assessment to be complete while patient is alone) Physical Abuse: Denies Verbal Abuse: Denies Sexual Abuse: Denies Exploitation of patient/patient's resources: Denies Self-Neglect: Denies Values / Beliefs Cultural Requests During Hospitalization: None Spiritual Requests During Hospitalization: None Consults Spiritual Care Consult Needed: No Social Work Consult Needed: No Merchant navy officer (For Healthcare) Does Patient Have a Medical Advance Directive?: No Would patient like information on creating a medical advance directive?: No - Patient declined          Disposition: Case was staffed with lord DNP who recommended patient be monitored  and observed for safety. Patient will be seen by psychiatry in the a.m.     Disposition Initial Assessment Completed for this Encounter: Yes Disposition of Patient: (Observe and monitor) Patient refused recommended treatment: No Mode of transportation if patient is discharged?: (Unk)  On Site Evaluation by:   Reviewed with Physician:    Alfredia Ferguson 03/12/2018 6:03 PM

## 2018-03-12 NOTE — ED Provider Notes (Signed)
[facility]   CSN: [id number] Arrival date & time: 03/12/18  1441     History   Chief Complaint Chief Complaint  Patient presents with  . Suicidal    HPI Dwayne Alvarez is a 52 y.o. male.  52 yo M with a chief complaint of suicidal ideation.  Patient was seen here yesterday with a similar complaint.  He thinks that things worsened at home and he had worsening healing that he might commit suicide.  He is planning on overdosing on heroin.  He called the helpline and I suggested come back here.  He denies any medical complaint.  The history is provided by the patient.  Illness  This is a new problem. The current episode started yesterday. The problem occurs constantly. The problem has not changed since onset.Pertinent negatives include no chest pain, no abdominal pain, no headaches and no shortness of breath. Nothing aggravates the symptoms. Nothing relieves the symptoms. He has tried nothing for the symptoms. The treatment provided no relief.    Past Medical History:  Diagnosis Date  . Hypertension     Patient Active Problem List   Diagnosis Date Noted  . Episode of recurrent major depressive disorder (HCC)   . Polysubstance abuse (HCC)   . Severe recurrent major depression w/psychotic features, mood-congruent (HCC) 02/28/2018  . Substance induced mood disorder (HCC) 12/30/2017  . MDD (major depressive disorder), recurrent severe, without psychosis (HCC) 12/30/2017  . Suicidal ideation   . Methamphetamine dependence (HCC)   . Cocaine use disorder, mild, abuse (HCC)   . Polysubstance dependence including opioid type drug, episodic abuse [facility](HCC)     Past Surgical History:  Procedure Laterality Date  . EYE SURGERY    . FOOT AMPUTATION THROUGH METATARSAL Right         Home Medications    Prior to Admission medications   Medication Sig Start Date End Date Taking? Authorizing Provider  ARIPiprazole (ABILIFY) 2 MG tablet  Take 1 tablet (2 mg total) by mouth daily. 03/12/18  Yes Laveda AbbeParks, Laurie Britton, NP  cloNIDine (CATAPRES) 0.1 MG tablet Take 1 tablet (0.1 mg total) by mouth daily before breakfast. For high blood pressure 03/05/18  Yes Nwoko, Agnes I, NP  DULoxetine (CYMBALTA) 30 MG capsule Take 1 capsule (30 mg total) by mouth daily. 03/12/18  Yes Laveda AbbeParks, Laurie Britton, NP  hydrOXYzine (ATARAX/VISTARIL) 25 MG tablet Take 1 tablet (25 mg total) by mouth every 6 (six) hours as needed for anxiety. 03/04/18  Yes Nwoko, Nicole KindredAgnes I, NP  nicotine (NICODERM CQ - DOSED IN MG/24 HOURS) 14 mg/24hr patch Place 1 patch (14 mg total) onto the skin daily. 03/12/18  Yes Laveda AbbeParks, Laurie Britton, NP  traZODone (DESYREL) 50 MG tablet Take 1 tablet (50 mg total) by mouth at bedtime as needed for sleep. 03/04/18  Yes Sanjuana KavaNwoko, Agnes I, NP    Family History History reviewed. No pertinent family history.  Social History Social History   Tobacco Use  . Smoking status: Current Every Day Smoker    Packs/day: 0.50    Years: 30.00    Pack years: 15.00    Types: Cigarettes  . Smokeless tobacco: Never Used  Substance Use Topics  . Alcohol use: Not Currently  . Drug use: Yes    Types: Methamphetamines, Cocaine, Oxycodone    Comment: heroin not used in past 4 days     Allergies   Patient has no known allergies.   Review of Systems Review of Systems  Constitutional: Negative for chills and fever.  HENT: Negative for congestion and facial swelling.   Eyes: Negative for discharge and visual disturbance.  Respiratory: Negative for shortness of breath.   Cardiovascular: Negative for chest pain and palpitations.  Gastrointestinal: Negative for abdominal pain, diarrhea and vomiting.  Musculoskeletal: Negative for arthralgias and myalgias.  Skin: Negative for color change and rash.  Neurological: Negative for tremors, syncope and headaches.  Psychiatric/Behavioral: Positive for suicidal ideas. Negative for confusion and dysphoric mood.      Physical Exam Updated Vital Signs BP 132/90 (BP Location: Left Arm)   Pulse 65   Temp 97.9 F (36.6 C) (Oral)   Resp 20   Ht 6' (1.829 m)   Wt 83.9 kg   SpO2 100%   BMI 25.09 kg/m   Physical Exam  Constitutional: He is oriented to person, place, and time. He appears well-developed and well-nourished.  HENT:  Head: Normocephalic and atraumatic.  Eyes: Pupils are equal, round, and reactive to light. EOM are normal.  Neck: Normal range of motion. Neck supple. No JVD present.  Cardiovascular: Normal rate and regular rhythm. Exam reveals no gallop and no friction rub.  No murmur heard. Pulmonary/Chest: No respiratory distress. He has no wheezes.  Abdominal: He exhibits no distension and no mass. There is no tenderness. There is no rebound and no guarding.  Musculoskeletal: Normal range of motion.  Small abrasion to the right shin.  Patient with pain to the amputated posterior aspect of the foot.  Neurological: He is alert and oriented to person, place, and time.  Skin: No rash noted. No pallor.  Psychiatric: He has a normal mood and affect. His behavior is normal. He expresses suicidal ideation. He expresses no homicidal ideation. He expresses suicidal plans. He expresses no homicidal plans.  Nursing note and vitals reviewed.    ED Treatments / Results  Labs (all labs ordered are listed, but only abnormal results are displayed) Labs Reviewed  COMPREHENSIVE METABOLIC PANEL - Abnormal; Notable for the following components:      Result Value   Albumin 3.4 (*)    AST 197 (*)    ALT 160 (*)    All other components within normal limits  ACETAMINOPHEN LEVEL - Abnormal; Notable for the following components:   Acetaminophen (Tylenol), Serum <10 (*)    All other components within normal limits  ETHANOL  SALICYLATE LEVEL  CBC  RAPID URINE DRUG SCREEN, HOSP PERFORMED    EKG None  Radiology No results found.  Procedures Procedures (including critical care  time)  Medications Ordered in ED Medications - No data to display   Initial Impression / Assessment and Plan / ED Course  I have reviewed the triage vital signs and the nursing notes.  Pertinent labs & imaging results that were available during my care of the patient were reviewed by me and considered in my medical decision making (see chart for details).     52 yo M with a chief complaint of suicidal ideation.  The patient plans on overdosing on heroin.  Patient was seen here yesterday and was deemed to be safe to go home.  Since then he feels like his desire to commit suicide has increased and he called the helpline who suggested he come back to the ED for evaluation.  I feel he is medically clear.  TTS evaluation.  TTS recommends obs overnight.   The patients results and plan were reviewed and discussed.   Any x-rays performed were independently reviewed  by myself.   Differential diagnosis were considered with the presenting HPI.  Medications - No data to display  Vitals:   03/12/18 1448 03/12/18 1459 03/12/18 1526 03/12/18 1824  BP:  (!) 124/96  132/90  Pulse:  73  65  Resp:  18  20  Temp:  98.5 F (36.9 C)  97.9 F (36.6 C)  TempSrc:  Oral  Oral  SpO2: 98% 100%  100%  Weight:   83.9 kg   Height:   6' (1.829 m)     Final diagnoses:  Suicidal ideation       Final Clinical Impressions(s) / ED Diagnoses   Final diagnoses:  Suicidal ideation    ED Discharge Orders    None       Melene Plan, DO 03/12/18 2121

## 2018-03-12 NOTE — ED Triage Notes (Signed)
Per EMS- Pt called with c/o "foot pain" and later added that he was having suicidal thoughts x 3 days. Pt is requesting help with suicidal thoughts. Denies plan. Denies HI. Pt is alert, oriented and appropriate.

## 2018-03-13 NOTE — ED Notes (Signed)
Pt was given belongings after being discharged by RN. Pt frustrated, due to being discharged.

## 2018-03-13 NOTE — Progress Notes (Signed)
Pt given discharge information. Received information with no problems. Reported an RN had taken $7.00 from him for safe-keeping after the money fell out of his pocket. Now reports that  he cannot find his money. $7.00 cash given to pt by this Clinical research associatewriter as pt reports he could not find his money and this Clinical research associatewriter could not as well. Taxi voucher provided by Child psychotherapistsocial worker. Pt left unit ambulatory accompanied by hospital security and [employer] police. Left in stable condition. Derinda SisVera Orine Goga,rn.

## 2018-03-13 NOTE — Progress Notes (Signed)
CSW provided patient with [facility]cher [facility] as patient is homeless and utilizes crutches, making bus transportation difficult.  Enid Cutterharlotte Delisia Mcquiston, LCSW-A Clinical Social Worker [phone]

## 2018-03-13 NOTE — Discharge Instructions (Signed)
For your mental health needs, please follow up at: ° °[facility] °[address] °[place], [place] [address] °[phone] ° °

## 2018-03-13 NOTE — Progress Notes (Signed)
CSW met with patient at bedside. Patient says he is not aware that he has been psychiatrically and medically cleared for discharge; CSW aware that TTS and Peer Support have already met with patient and made patient aware.  CSW offered patient outpatient resources, patient declined resources, patient expressed frustration. CSW offered patient assistance with transportation, patient declined at this time. CSW made patient aware transportation assistance still available if he should request them.  Stephanie Acre, [place] Social Worker [phone]

## 2018-03-13 NOTE — Consult Note (Addendum)
[facility] ED Discharge  03/13/2018 12:13 PM Dwayne Alvarez  MRN:  [id number] Principal Problem: <principal problem not specified> Discharge Diagnoses:  Patient Active Problem List   Diagnosis Date Noted  . Episode of recurrent major depressive disorder (HCC) [F33.9]   . Polysubstance abuse (HCC) [F19.10]   . Severe recurrent major depression w/psychotic features, mood-congruent (HCC) [F33.3] 02/28/2018  . Substance induced mood disorder (HCC) [F19.94] 12/30/2017  . MDD (major depressive disorder), recurrent severe, without psychosis (HCC) [F33.2] 12/30/2017  . Suicidal ideation [R45.851]   . 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 Cocaine use disorder, mild, abuse (HCC) [F14.10]   . Polysubstance dependence including opioid type drug, episodic abuse (HCC) [F11.20, F19.20]     Subjective: Pt was seen and chart reviewed with treatment team and Dr Jannifer Franklin. Pt has been a frequent visitor to the [facility] in the past several days. He comes in stating he wants to go to rehab for his drug use and [facility] has been working with him to help find a place. Pt has had a prior R partial foot amputation and due to his ambulatory issues, he walks with crutches, he has been denied at several places. Pt was here on 03-11-18 and was discharged. At the time of discharge he had the choice to go to a shelter in [place], [place] but instead chose to go stay at a friend's home. Today he is stating he was told by an intake worker at [facility] to come to the emergency room and wait until Monday and there would be a bed for him. The process of referral and admission to inpatient facilities does not work in this manner and pt's are not held in the emergency room to await bed placement in a psychiatric facility. Pt's UDS and BAL are negative, remaining lab work is unremarkable. Pt is stable and psychiatrically clear for discharge.   Total Time spent with patient: 45 minutes  Past Psychiatric History: As  above  Past Medical History:  Past Medical History:  Diagnosis Date  . Hypertension    Past Surgical History:  Procedure Laterality Date  . EYE SURGERY    . FOOT AMPUTATION THROUGH METATARSAL Right    Family History: History reviewed. No pertinent family history. Family Psychiatric  History: Unknown Social History:  Social History   Substance and Sexual Activity  Alcohol Use Not Currently    Social History   Substance and Sexual Activity  Drug Use Yes  . Types: Methamphetamines, Cocaine, Oxycodone   Comment: heroin not used in past 4 days   Social History   Socioeconomic History  . Marital status: Single    Spouse name: Not on file  . Number of children: Not on file  . Years of education: Not on file  . Highest education level: Not on file  Occupational History  . Not on file  Social Needs  . Financial resource strain: Not on file  . Food insecurity:    Worry: Not on file    Inability: Not on file  . Transportation needs:    Medical: Not on file    Non-medical: Not on file  Tobacco Use  . Smoking status: Current Every Day Smoker    Packs/day: 0.50    Years: 30.00    Pack years: 15.00    Types: Cigarettes  . Smokeless tobacco: Never Used  Substance and Sexual Activity  . Alcohol use: Not Currently  . Drug use: Yes    Types: Methamphetamines, Cocaine, Oxycodone  Comment: heroin not used in past 4 days  . Sexual activity: Not Currently  Lifestyle  . Physical activity:    Days per week: Not on file    Minutes per session: Not on file  . Stress: Not on file  Relationships  . Social connections:    Talks on phone: Not on file    Gets together: Not on file    Attends religious service: Not on file    Active member of club or organization: Not on file    Attends meetings of clubs or organizations: Not on file    Relationship status: Not on file  Other Topics Concern  . Not on file  Social History Narrative  . Not on file    Has this patient used any  form of tobacco in the last 30 days? (Cigarettes, Smokeless Tobacco, Cigars, and/or Pipes) Prescription not provided because: Pt  declined  Current Medications: Current Facility-Administered Medications  Medication Dose Route Frequency Provider Last Rate Last Dose  . ARIPiprazole (ABILIFY) tablet 2 mg  2 mg Oral Daily Melene Plan, DO   2 mg at 03/13/18 0949  . cloNIDine (CATAPRES) tablet 0.1 mg  0.1 mg Oral QAC breakfast Melene Plan, DO   0.1 mg at 03/13/18 0949  . DULoxetine (CYMBALTA) DR capsule 30 mg  30 mg Oral Daily Melene Plan, DO   30 mg at 03/13/18 0949  . hydrOXYzine (ATARAX/VISTARIL) tablet 25 mg  25 mg Oral Q6H PRN Melene Plan, DO      . nicotine (NICODERM CQ - dosed in mg/24 hours) patch 14 mg  14 mg Transdermal Daily Melene Plan, DO   14 mg at 03/13/18 0949  . traZODone (DESYREL) tablet 50 mg  50 mg Oral QHS PRN Melene Plan, DO       Current Outpatient Medications  Medication Sig Dispense Refill  . ARIPiprazole (ABILIFY) 2 MG tablet Take 1 tablet (2 mg total) by mouth daily. 30 tablet 0  . cloNIDine (CATAPRES) 0.1 MG tablet Take 1 tablet (0.1 mg total) by mouth daily before breakfast. For high blood pressure 15 tablet 0  . DULoxetine (CYMBALTA) 30 MG capsule Take 1 capsule (30 mg total) by mouth daily. 30 capsule 0  . hydrOXYzine (ATARAX/VISTARIL) 25 MG tablet Take 1 tablet (25 mg total) by mouth every 6 (six) hours as needed for anxiety. 30 tablet 0  . nicotine (NICODERM CQ - DOSED IN MG/24 HOURS) 14 mg/24hr patch Place 1 patch (14 mg total) onto the skin daily. 28 patch 0  . traZODone (DESYREL) 50 MG tablet Take 1 tablet (50 mg total) by mouth at bedtime as needed for sleep. 30 tablet 0    Musculoskeletal: Strength & Muscle Tone: within normal limits Gait & Station: normal Patient leans: N/A  Psychiatric Specialty Exam: Physical Exam  Constitutional: He is oriented to person, place, and time. He appears well-developed and well-nourished.  HENT:  Head: Normocephalic.   Respiratory: Effort normal.  Musculoskeletal: Normal range of motion.  Neurological: He is alert and oriented to person, place, and time.  Psychiatric: His speech is normal and behavior is normal. Thought content normal. Cognition and memory are normal. Cognition and memory are not impaired. He expresses impulsivity. He exhibits a depressed mood.    Review of Systems  Psychiatric/Behavioral: Positive for depression. Negative for hallucinations, memory loss, substance abuse and suicidal ideas. The patient is not nervous/anxious and does not have insomnia.   All other systems reviewed and are negative.   Blood pressure Marland Kitchen)  147/91, pulse 60, temperature (!) 97.5 F (36.4 C), temperature source Oral, resp. rate 20, height 6' (1.829 m), weight 83.9 kg, SpO2 99 %.Body mass index is 25.09 kg/m.  General Appearance: Casual  Eye Contact:  Good  Speech:  Clear and Coherent and Normal Rate  Volume:  Normal  Mood:  Anxious and Depressed  Affect:  Congruent and Depressed  Thought Process:  Coherent, Goal Directed and Linear  Orientation:  Full (Time, Place, and Person)  Thought Content:  Logical  Suicidal Thoughts:  No  Homicidal Thoughts:  No  Memory:  Immediate;   Good Recent;   Good Remote;   Fair  Judgement:  Fair  Insight:  Fair  Psychomotor Activity:  Normal  Concentration:  Concentration: Good and Attention Span: Good  Recall:  Good  Fund of Knowledge:  Good  Language:  Good  Akathisia:  No  Handed:  Right  AIMS (if indicated):     Assets:  Communication Skills Desire for Improvement  ADL's:  Intact  Cognition:  WNL  Sleep:        Demographic Factors:  Male, Caucasian and Unemployed  Loss Factors: Financial problems/change in socioeconomic status  Historical Factors: Impulsivity  Risk Reduction Factors:   Sense of responsibility to family  Continued Clinical Symptoms:  Depression:   Impulsivity Alcohol/Substance Abuse/Dependencies  Cognitive Features That  Contribute To Risk:  Closed-mindedness    Suicide Risk:  Minimal: No identifiable suicidal ideation.  Patients presenting with no risk factors but with morbid ruminations; may be classified as minimal risk based on the severity of the depressive symptoms    Plan Of Care/Follow-up recommendations:  Activity:  as tolerated Diet:  Heart healthy  Disposition: Take all medications as prescribed. Keep all follow-up appointments as scheduled.  Do not consume alcohol or use illegal drugs while on prescription medications. Report any adverse effects from your medications to your primary care provider promptly.  In the event of recurrent symptoms or worsening symptoms, call 911, a crisis hotline, or go to the nearest emergency department for evaluation.   Laveda AbbeLaurie Britton Parks, NP 03/13/2018, 12:13 PM  Patient seen face-to-face for psychiatric evaluation, chart reviewed and case discussed with the physician extender and developed treatment plan. Reviewed the information documented and agree with the treatment plan. Thedore MinsMojeed Seymore Brodowski, MD

## 2018-03-13 NOTE — Patient Outreach (Signed)
[facility] met with the patient to provide substance use recovery support and help with recovery resources. Patient plans to follow up with [facility] for a possible admission on Monday. [facility] met with patient on 03/09/18 03/10/18 and 03/11/18 to help with possible detox/dual diagnosis needs admission. CPSS tried [facility], and [facility], but the patient was denied for treatment. CPSS also tried [facility], but they did not have any beds available until sometime next week and do not do admissions on the weekend. Patient also stated that he wants help with [facility] admission. [facility] explained to the patient that [facility] does not control whether or not someone is admitted to [facility]. Patient was recently discharged from [facility] on 03/04/18. CSW will provide the patient help with transportation with a taxi voucher.

## 2019-07-05 IMAGING — DX DG ANKLE COMPLETE 3+V*L*
3 series · 3 of 3 positions shown · non-contrast
Comparison: LEFT foot radiograph February 08, 2018

CLINICAL DATA: Pain and swelling, twisted ankle this morning.

EXAM:
LEFT ANKLE COMPLETE - 3+ VIEW

[ankle ap]
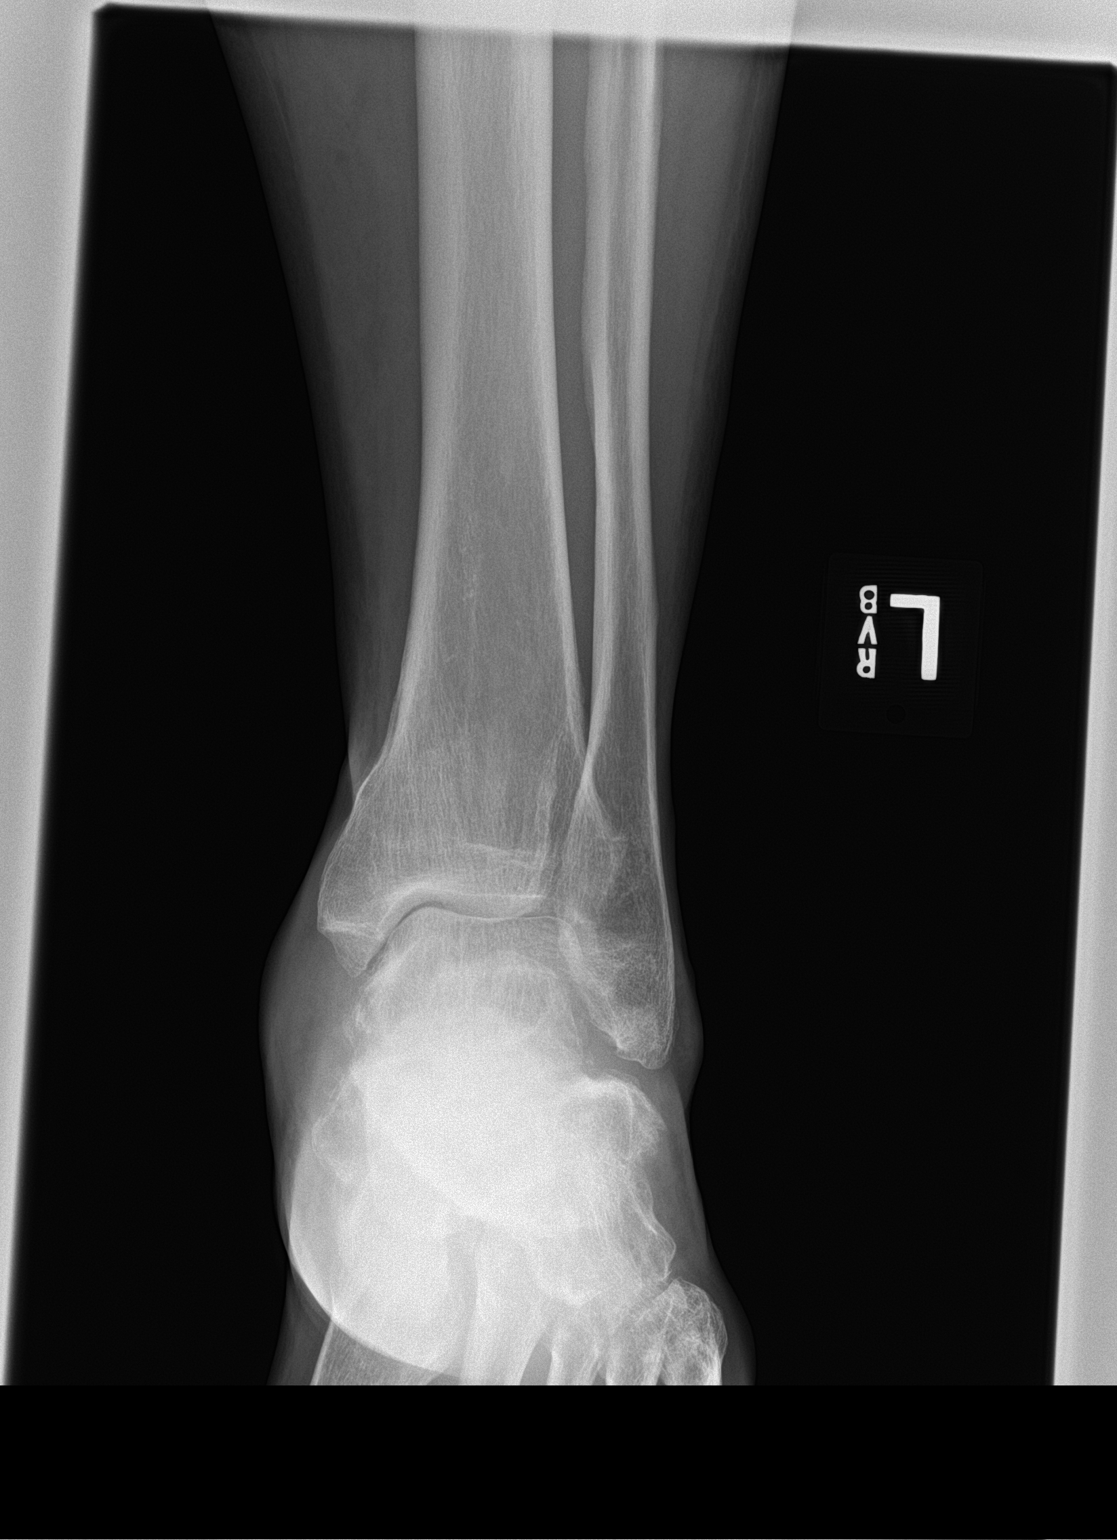

[ankle obl]
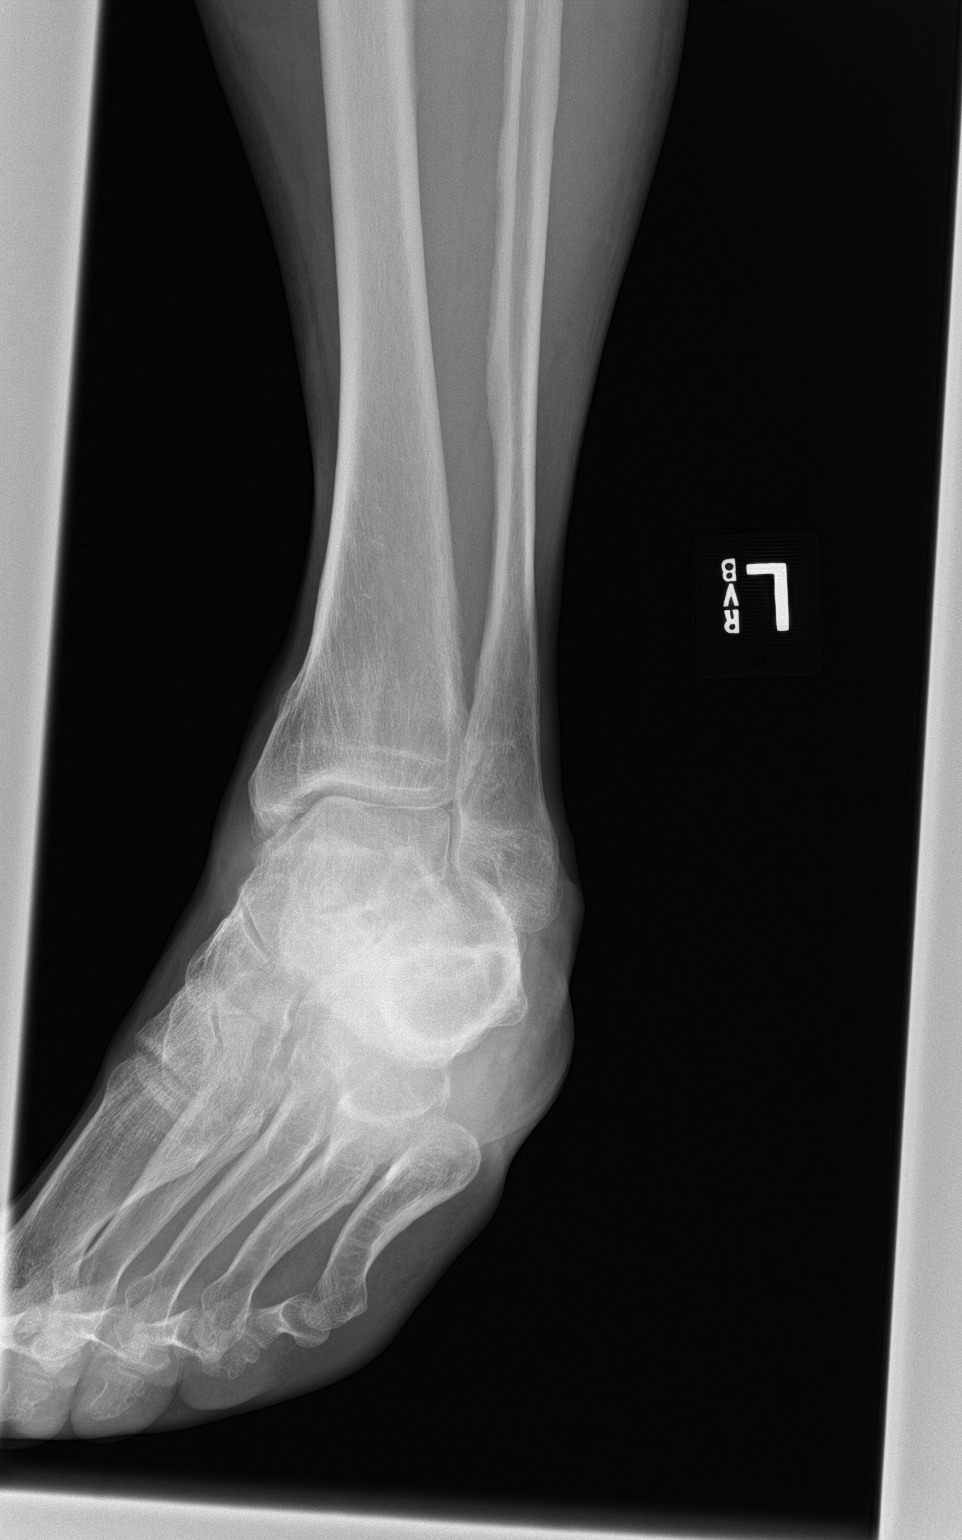

[ankle lat]
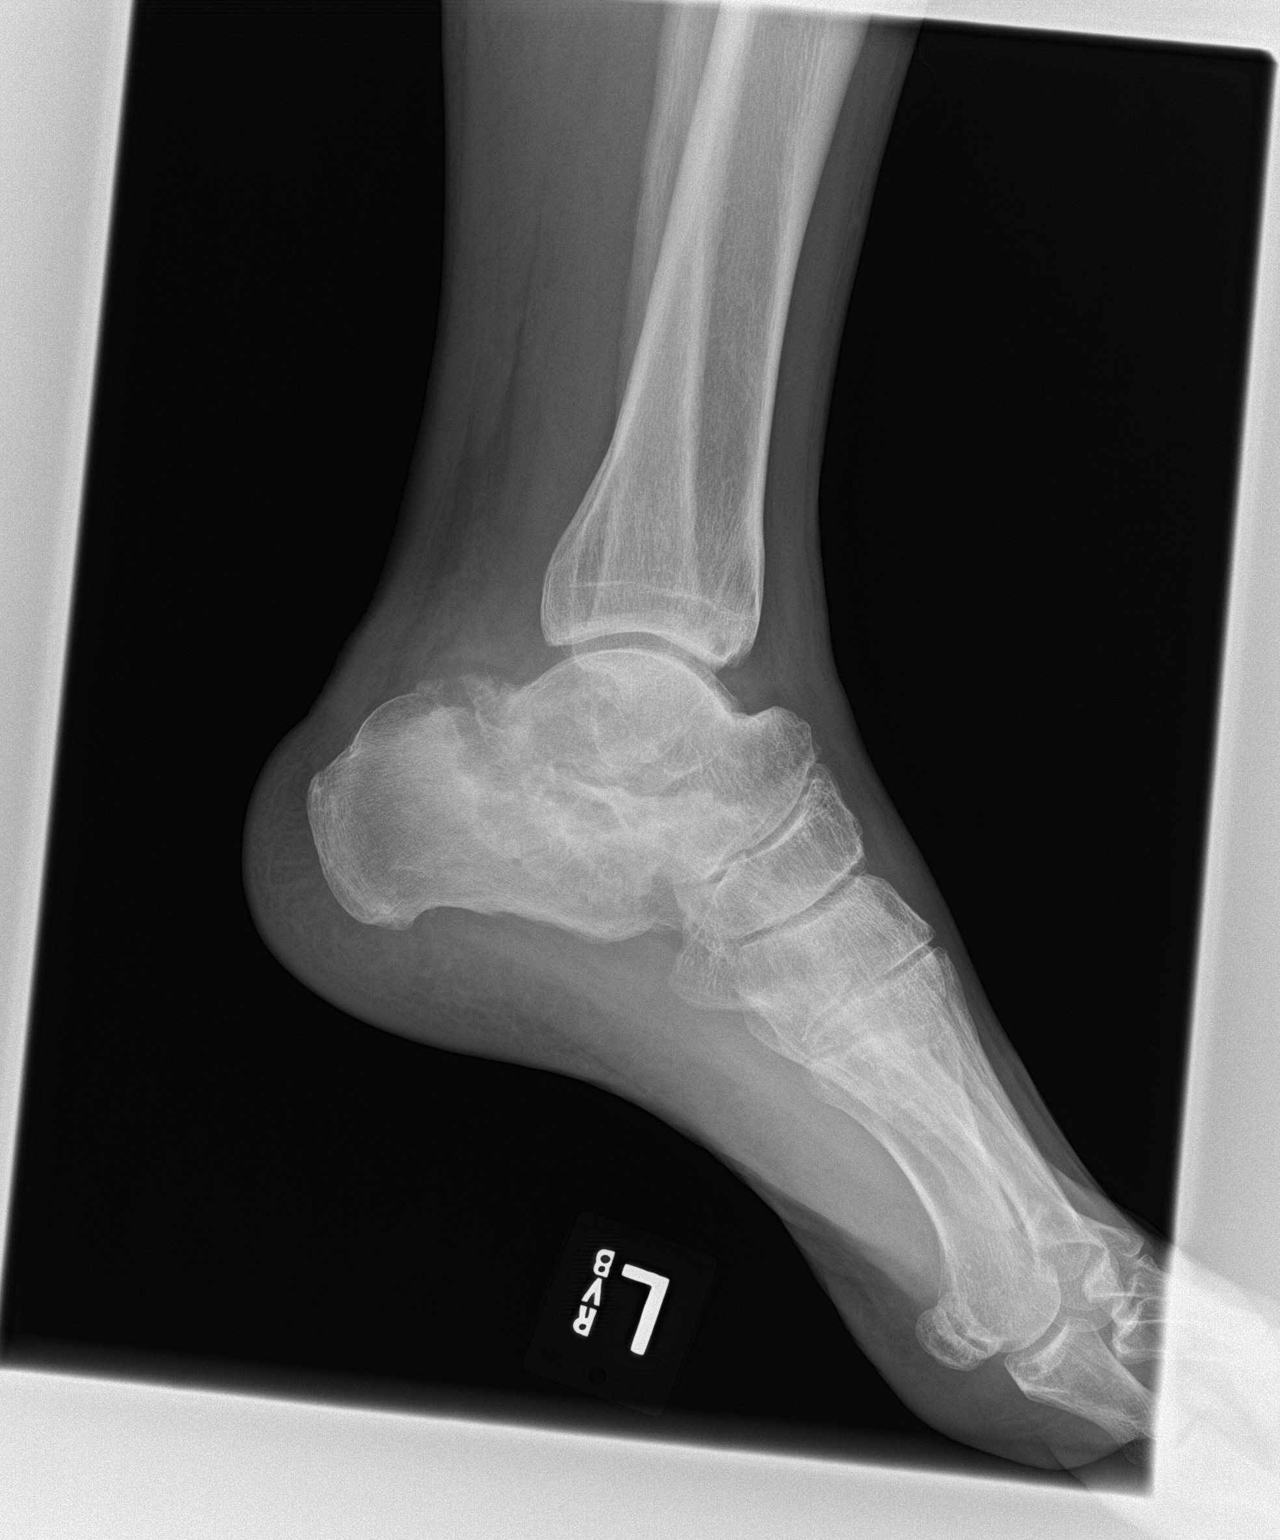

[3 of 3 positions shown; findings below may reference images not displayed]

FINDINGS: No fracture deformity nor dislocation. Complete loss of
talocalcaneal joint space with periarticular sclerosis and
subchondral cyst formation. Tiny corticated fragmentation lateral
ankle mortise. The ankle mortise appears congruent and the
tibiofibular syndesmosis intact. No destructive bony lesions.
Hindfoot soft tissue swelling without subcutaneous gas or radiopaque
foreign bodies.
IMPRESSION: 1. Soft tissue swelling without acute osseous process.
2. Severe talocalcaneal osteoarthrosis.
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
